# Patient Record
Sex: Female | Born: 1990 | Race: Black or African American | Hispanic: No | Marital: Single | Smoking: Never smoker
Health system: Southern US, Community
[De-identification: ages and names within clinical notes are randomized; demographics above are authoritative.]

## PROBLEM LIST (undated history)

## (undated) DIAGNOSIS — N83209 Unspecified ovarian cyst, unspecified side: Secondary | ICD-10-CM

## (undated) DIAGNOSIS — I10 Essential (primary) hypertension: Secondary | ICD-10-CM

## (undated) DIAGNOSIS — Z87440 Personal history of urinary (tract) infections: Secondary | ICD-10-CM

## (undated) HISTORY — PX: NO PAST SURGERIES: SHX2092

## (undated) HISTORY — DX: Personal history of urinary (tract) infections: Z87.440

## (undated) HISTORY — DX: Essential (primary) hypertension: I10

## (undated) HISTORY — PX: OTHER SURGICAL HISTORY: SHX169

---
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
## 2012-11-23 ENCOUNTER — Emergency Department (HOSPITAL_COMMUNITY)
Admission: EM | Admit: 2012-11-23 | Discharge: 2012-11-23 | Disposition: A | Discharge status: Home / Self Care | Payer: No Typology Code available for payment source | Attending: Emergency Medicine | Admitting: Emergency Medicine

## 2012-11-23 DIAGNOSIS — S8010XA Contusion of unspecified lower leg, initial encounter: Secondary | ICD-10-CM | POA: Insufficient documentation

## 2012-11-23 DIAGNOSIS — T148XXA Other injury of unspecified body region, initial encounter: Secondary | ICD-10-CM

## 2012-11-23 DIAGNOSIS — Y92410 Unspecified street and highway as the place of occurrence of the external cause: Secondary | ICD-10-CM | POA: Insufficient documentation

## 2012-11-23 DIAGNOSIS — Y9389 Activity, other specified: Secondary | ICD-10-CM | POA: Insufficient documentation

## 2012-11-23 DIAGNOSIS — IMO0002 Reserved for concepts with insufficient information to code with codable children: Secondary | ICD-10-CM | POA: Insufficient documentation

## 2012-11-23 MED ORDER — IBUPROFEN 600 MG PO TABS: 600.0000 mg | ORAL_TABLET | Freq: Once | ORAL | Status: DC

## 2012-11-23 MED ORDER — IBUPROFEN 200 MG PO TABS
600.0000 mg | ORAL_TABLET | Freq: Once | ORAL | Status: AC
  Administered 2012-11-23: 600 mg via ORAL
  Filled 2012-11-23: qty 3

## 2012-11-23 NOTE — ED Provider Notes (Signed)
Medical screening examination/treatment/procedure(s) were performed by non-physician practitioner and as supervising physician I was immediately available for consultation/collaboration.  EKG Interpretation   None        Derwood Kaplan, MD 11/23/12 2216
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
## 2012-11-23 NOTE — ED Notes (Signed)
Patient is alert and oriented x3.  She was given DC instructions and follow up visit instructions.  Patient gave verbal understanding. She was DC ambulatory under her own power to home.  V/S stable.  He was not showing any signs of distress on DC 

## 2015-01-01 ENCOUNTER — Emergency Department (HOSPITAL_COMMUNITY): Payer: BLUE CROSS/BLUE SHIELD

## 2015-01-01 ENCOUNTER — Observation Stay (HOSPITAL_COMMUNITY)
Admission: EM | Admit: 2015-01-01 | Discharge: 2015-01-02 | Disposition: A | Discharge status: Other Acute Inpatient Hospital | Payer: BLUE CROSS/BLUE SHIELD | Attending: Obstetrics & Gynecology | Admitting: Obstetrics & Gynecology

## 2015-01-01 ENCOUNTER — Encounter (HOSPITAL_COMMUNITY): Payer: Self-pay | Admitting: Emergency Medicine

## 2015-01-01 DIAGNOSIS — R109 Unspecified abdominal pain: Secondary | ICD-10-CM | POA: Diagnosis present

## 2015-01-01 DIAGNOSIS — N831 Corpus luteum cyst of ovary, unspecified side: Secondary | ICD-10-CM | POA: Diagnosis present

## 2015-01-01 DIAGNOSIS — K659 Peritonitis, unspecified: Secondary | ICD-10-CM

## 2015-01-01 DIAGNOSIS — N83209 Unspecified ovarian cyst, unspecified side: Secondary | ICD-10-CM

## 2015-01-01 DIAGNOSIS — N83299 Other ovarian cyst, unspecified side: Principal | ICD-10-CM | POA: Insufficient documentation

## 2015-01-01 DIAGNOSIS — R102 Pelvic and perineal pain: Secondary | ICD-10-CM

## 2015-01-01 DIAGNOSIS — N72 Inflammatory disease of cervix uteri: Secondary | ICD-10-CM

## 2015-01-01 DIAGNOSIS — R58 Hemorrhage, not elsewhere classified: Secondary | ICD-10-CM

## 2015-01-01 LAB — CBC WITH DIFFERENTIAL/PLATELET
BASOS PCT: 0 %
BASOS PCT: 0 %
Basophils Absolute: 0 10*3/uL (ref 0.0–0.1)
Basophils Absolute: 0 10*3/uL (ref 0.0–0.1)
EOS ABS: 0 10*3/uL (ref 0.0–0.7)
EOS ABS: 0 10*3/uL (ref 0.0–0.7)
Eosinophils Relative: 0 %
Eosinophils Relative: 0 %
HCT: 30 % — ABNORMAL LOW (ref 36.0–46.0)
HEMATOCRIT: 25.9 % — AB (ref 36.0–46.0)
HEMOGLOBIN: 9.7 g/dL — AB (ref 12.0–15.0)
HEMOGLOBIN: 8.6 g/dL — AB (ref 12.0–15.0)
LYMPHS ABS: 1 10*3/uL (ref 0.7–4.0)
Lymphocytes Relative: 4 %
Lymphocytes Relative: 7 %
Lymphs Abs: 0.6 10*3/uL — ABNORMAL LOW (ref 0.7–4.0)
MCH: 27.9 pg (ref 26.0–34.0)
MCH: 28.7 pg (ref 26.0–34.0)
MCHC: 32.3 g/dL (ref 30.0–36.0)
MCHC: 33.2 g/dL (ref 30.0–36.0)
MCV: 86.2 fL (ref 78.0–100.0)
MCV: 86.3 fL (ref 78.0–100.0)
MONOS PCT: 3 %
Monocytes Absolute: 0.4 10*3/uL (ref 0.1–1.0)
Monocytes Absolute: 0.4 10*3/uL (ref 0.1–1.0)
Monocytes Relative: 3 %
NEUTROS ABS: 15.5 10*3/uL — AB (ref 1.7–7.7)
NEUTROS ABS: 11.7 10*3/uL — AB (ref 1.7–7.7)
NEUTROS PCT: 94 %
NEUTROS PCT: 90 %
Platelets: 274 10*3/uL (ref 150–400)
Platelets: 249 10*3/uL (ref 150–400)
RBC: 3.48 MIL/uL — AB (ref 3.87–5.11)
RBC: 3 MIL/uL — AB (ref 3.87–5.11)
RDW: 13.2 % (ref 11.5–15.5)
RDW: 13.3 % (ref 11.5–15.5)
WBC: 16.5 10*3/uL — AB (ref 4.0–10.5)
WBC: 13.1 10*3/uL — AB (ref 4.0–10.5)

## 2015-01-01 LAB — I-STAT CHEM 8, ED
BUN: 15 mg/dL (ref 6–20)
CALCIUM ION: 1.17 mmol/L (ref 1.12–1.23)
CHLORIDE: 104 mmol/L (ref 101–111)
Creatinine, Ser: 0.7 mg/dL (ref 0.44–1.00)
GLUCOSE: 121 mg/dL — AB (ref 65–99)
HCT: 36 % (ref 36.0–46.0)
Hemoglobin: 12.2 g/dL (ref 12.0–15.0)
Potassium: 4.3 mmol/L (ref 3.5–5.1)
Sodium: 138 mmol/L (ref 135–145)
TCO2: 24 mmol/L (ref 0–100)

## 2015-01-01 LAB — CBC
HCT: 35.5 % — ABNORMAL LOW (ref 36.0–46.0)
Hemoglobin: 11.3 g/dL — ABNORMAL LOW (ref 12.0–15.0)
MCH: 27.6 pg (ref 26.0–34.0)
MCHC: 31.8 g/dL (ref 30.0–36.0)
MCV: 86.6 fL (ref 78.0–100.0)
PLATELETS: 302 10*3/uL (ref 150–400)
RBC: 4.1 MIL/uL (ref 3.87–5.11)
RDW: 13.2 % (ref 11.5–15.5)
WBC: 20.7 10*3/uL — ABNORMAL HIGH (ref 4.0–10.5)

## 2015-01-01 LAB — COMPREHENSIVE METABOLIC PANEL
ALBUMIN: 3.9 g/dL (ref 3.5–5.0)
ALK PHOS: 52 U/L (ref 38–126)
ALT: 12 U/L — AB (ref 14–54)
AST: 20 U/L (ref 15–41)
Anion gap: 10 (ref 5–15)
BUN: 15 mg/dL (ref 6–20)
CALCIUM: 9.2 mg/dL (ref 8.9–10.3)
CHLORIDE: 105 mmol/L (ref 101–111)
CO2: 23 mmol/L (ref 22–32)
CREATININE: 0.8 mg/dL (ref 0.44–1.00)
GFR calc non Af Amer: >60 mL/min (ref >60)
GLUCOSE: 129 mg/dL — AB (ref 65–99)
Potassium: 4.2 mmol/L (ref 3.5–5.1)
SODIUM: 138 mmol/L (ref 135–145)
Total Bilirubin: 0.8 mg/dL (ref 0.3–1.2)
Total Protein: 7.5 g/dL (ref 6.5–8.1)

## 2015-01-01 LAB — URINE MICROSCOPIC-ADD ON

## 2015-01-01 LAB — URINALYSIS, ROUTINE W REFLEX MICROSCOPIC
BILIRUBIN URINE: NEGATIVE
GLUCOSE, UA: NEGATIVE mg/dL
HGB URINE DIPSTICK: NEGATIVE
KETONES UR: 15 mg/dL — AB
NITRITE: NEGATIVE
PH: 5.5 (ref 5.0–8.0)
Protein, ur: 30 mg/dL — AB
SPECIFIC GRAVITY, URINE: 1.024 (ref 1.005–1.030)

## 2015-01-01 LAB — POC URINE PREG, ED: Preg Test, Ur: NEGATIVE

## 2015-01-01 LAB — CBG MONITORING, ED: Glucose-Capillary: 105 mg/dL — ABNORMAL HIGH (ref 65–99)

## 2015-01-01 LAB — HEMOGLOBIN AND HEMATOCRIT, BLOOD
HEMATOCRIT: 28.4 % — AB (ref 36.0–46.0)
HEMOGLOBIN: 9 g/dL — AB (ref 12.0–15.0)

## 2015-01-01 LAB — HCG, QUANTITATIVE, PREGNANCY: hCG, Beta Chain, Quant, S: <1 m[IU]/mL (ref <5)

## 2015-01-01 LAB — WET PREP, GENITAL
SPERM: NONE SEEN
TRICH WET PREP: NONE SEEN
YEAST WET PREP: NONE SEEN

## 2015-01-01 LAB — LIPASE, BLOOD: LIPASE: 26 U/L (ref 11–51)

## 2015-01-01 MED ORDER — MORPHINE SULFATE (PF) 2 MG/ML IV SOLN
2.0000 mg | Freq: Once | INTRAVENOUS | Status: AC
  Administered 2015-01-01: 2 mg via INTRAVENOUS
  Filled 2015-01-01: qty 1

## 2015-01-01 MED ORDER — MORPHINE SULFATE (PF) 4 MG/ML IV SOLN
4.0000 mg | Freq: Once | INTRAVENOUS | Status: AC
  Administered 2015-01-01: 4 mg via INTRAVENOUS
  Filled 2015-01-01: qty 1

## 2015-01-01 MED ORDER — SODIUM CHLORIDE 0.9 % IV SOLN
8.0000 mg | Freq: Three times a day (TID) | INTRAVENOUS | Status: DC | PRN
  Administered 2015-01-01: 8 mg via INTRAVENOUS
  Filled 2015-01-01 (×2): qty 4

## 2015-01-01 MED ORDER — HYDROMORPHONE HCL 1 MG/ML IJ SOLN
1.0000 mg | INTRAMUSCULAR | Status: DC | PRN
  Administered 2015-01-01: 1 mg via INTRAVENOUS
  Filled 2015-01-01: qty 1

## 2015-01-01 MED ORDER — STERILE WATER FOR INJECTION IJ SOLN
INTRAMUSCULAR | Status: AC
  Administered 2015-01-01: 10 mL
  Filled 2015-01-01: qty 10

## 2015-01-01 MED ORDER — OXYCODONE-ACETAMINOPHEN 5-325 MG PO TABS
1.0000 | ORAL_TABLET | ORAL | Status: DC | PRN
  Administered 2015-01-01 – 2015-01-02 (×2): 2 via ORAL
  Filled 2015-01-01 (×2): qty 2

## 2015-01-01 MED ORDER — ONDANSETRON HCL 4 MG/2ML IJ SOLN
4.0000 mg | Freq: Once | INTRAMUSCULAR | Status: AC
  Administered 2015-01-01: 4 mg via INTRAVENOUS
  Filled 2015-01-01: qty 2

## 2015-01-01 MED ORDER — SODIUM CHLORIDE 0.9 % IV BOLUS (SEPSIS)
1000.0000 mL | Freq: Once | INTRAVENOUS | Status: AC
  Administered 2015-01-01: 1000 mL via INTRAVENOUS

## 2015-01-01 MED ORDER — AZITHROMYCIN 1 G PO PACK
1.0000 g | PACK | Freq: Once | ORAL | Status: AC
  Administered 2015-01-01: 1 g via ORAL
  Filled 2015-01-01: qty 1

## 2015-01-01 MED ORDER — HYDROMORPHONE HCL 1 MG/ML IJ SOLN
1.0000 mg | Freq: Once | INTRAMUSCULAR | Status: AC
Start: 2015-01-01 — End: 2015-01-01
  Administered 2015-01-01: 1 mg via INTRAVENOUS
  Filled 2015-01-01: qty 1

## 2015-01-01 MED ORDER — IOHEXOL 300 MG/ML  SOLN
50.0000 mL | Freq: Once | INTRAMUSCULAR | Status: AC | PRN
  Administered 2015-01-01: 50 mL via ORAL

## 2015-01-01 MED ORDER — CEFTRIAXONE SODIUM 250 MG IJ SOLR
250.0000 mg | Freq: Once | INTRAMUSCULAR | Status: AC
  Administered 2015-01-01: 250 mg via INTRAMUSCULAR
  Filled 2015-01-01: qty 250

## 2015-01-01 MED ORDER — DICYCLOMINE HCL 10 MG/ML IM SOLN
20.0000 mg | Freq: Once | INTRAMUSCULAR | Status: AC
  Administered 2015-01-01: 20 mg via INTRAMUSCULAR
  Filled 2015-01-01: qty 2

## 2015-01-01 MED ORDER — KETOROLAC TROMETHAMINE 30 MG/ML IJ SOLN
30.0000 mg | Freq: Once | INTRAMUSCULAR | Status: AC
  Administered 2015-01-01: 30 mg via INTRAVENOUS
  Filled 2015-01-01: qty 1

## 2015-01-01 MED ORDER — FENTANYL CITRATE (PF) 100 MCG/2ML IJ SOLN
50.0000 ug | Freq: Once | INTRAMUSCULAR | Status: AC
  Administered 2015-01-01: 50 ug via INTRAVENOUS
  Filled 2015-01-01: qty 2

## 2015-01-01 MED ORDER — IOHEXOL 300 MG/ML  SOLN
100.0000 mL | Freq: Once | INTRAMUSCULAR | Status: AC | PRN
  Administered 2015-01-01: 100 mL via INTRAVENOUS

## 2015-01-01 NOTE — ED Notes (Signed)
Per pts mother pt was sitting on the couch complaining of abdominal pain and then fell off of the cough in a fainting episode. Pt's mother unsure of how long she was out. Pt states she had a normal bowel movement at about 0040 tonight.

## 2015-01-01 NOTE — ED Notes (Signed)
Mother reports hx of Crohns in the family.

## 2015-01-01 NOTE — ED Notes (Signed)
Pt transported to CT ?

## 2015-01-01 NOTE — ED Provider Notes (Signed)
I received this patient in signout from Dr. Nicanor AlconPalumbo. Per her report, the patient presented with abdominal pain, vomiting, diarrhea, and an episode of syncope. CT head showed free fluid in the abdomen and complex ovarian structure concerning for cyst. We were awaiting results of ultrasound. Ultrasound showed a hemorrhagic cyst with the source of bleeding. Repeat hemoglobin is slightly lower than the last; 8.6 from 11.3 originally. Discussed w/ OBGYN, Dr. Despina HiddenEure, and recommended transfer for pain control and serial H/H. He has accepted pt and pt/mother in agreement. [facility] in stable condition.  Laurence Spatesachel Morgan Little, MD 01/01/15 [phone]

## 2015-01-01 NOTE — ED Provider Notes (Signed)
CSN: [id number]     Arrival date & time 01/01/15  0023 History   First MD Initiated Contact with Patient 01/01/15 [phone]     Chief Complaint  Patient presents with  . Abdominal Pain  . Near Syncope     (Consider location/radiation/quality/duration/timing/severity/associated sxs/prior Treatment) Patient is a 24 y.o. female presenting with abdominal pain. The history is provided by the patient.  Abdominal Pain Pain location:  Suprapubic Pain quality: stabbing   Pain radiation: entire abdomen. Pain severity:  Severe Onset quality:  Sudden Timing:  Constant Progression:  Unchanged Chronicity:  New Context: not trauma   Relieved by:  Nothing Worsened by:  Nothing tried Ineffective treatments:  None tried Associated symptoms: diarrhea, nausea and vomiting   Associated symptoms: no dysuria, no fever and no vaginal bleeding   Diarrhea:    Quality:  Watery   Number of occurrences:  5   Severity:  Moderate   Timing:  Intermittent   Progression:  Unchanged Risk factors: no recent hospitalization     History reviewed. No pertinent past medical history. History reviewed. No pertinent past surgical history. History reviewed. No pertinent family history. Social History  Substance Use Topics  . Smoking status: Never Smoker   . Smokeless tobacco: None  . Alcohol Use: No   OB History    No data available     Review of Systems  Constitutional: Negative for fever.  Gastrointestinal: Positive for nausea, vomiting, abdominal pain and diarrhea.  Genitourinary: Negative for dysuria and vaginal bleeding.  All other systems reviewed and are negative.     Allergies  Review of patient's allergies indicates no known allergies.  Home Medications   Prior to Admission medications   Not on File   BP 123/79 mmHg  Pulse 75  Temp(Src) 97.9 F (36.6 C) (Oral)  Resp 19  SpO2 100%  LMP 11/22/2014 Physical Exam  Constitutional: She is oriented to person, place, and time. She appears  well-developed and well-nourished. No distress.  HENT:  Head: Normocephalic and atraumatic.  Mouth/Throat: Oropharynx is clear and moist.  Eyes: Conjunctivae are normal. Pupils are equal, round, and reactive to light.  Neck: Normal range of motion. Neck supple.  Cardiovascular: Normal rate, regular rhythm and intact distal pulses.   Pulmonary/Chest: Effort normal and breath sounds normal. She has no wheezes. She has no rales.  Abdominal: She exhibits no distension and no mass. Bowel sounds are decreased. There is tenderness. There is guarding. There is no rebound, no tenderness at McBurney's point and negative Murphy's sign.  Genitourinary: Vaginal discharge found.  Yellowish, frothy chaperone present  Musculoskeletal: Normal range of motion.  Neurological: She is alert and oriented to person, place, and time.  Skin: Skin is warm and dry.  Psychiatric: She has a normal mood and affect.    ED Course  Procedures (including critical care time) Labs Review Labs Reviewed  URINALYSIS, ROUTINE W REFLEX MICROSCOPIC (NOT AT [facility]) - Abnormal; Notable for the following:    APPearance CLOUDY (*)    Ketones, ur 15 (*)    Protein, ur 30 (*)    Leukocytes, UA TRACE (*)    All other components within normal limits  COMPREHENSIVE METABOLIC PANEL - Abnormal; Notable for the following:    Glucose, Bld 129 (*)    ALT 12 (*)    All other components within normal limits  CBC - Abnormal; Notable for the following:    WBC 20.7 (*)    Hemoglobin 11.3 (*)    HCT  35.5 (*)    All other components within normal limits  URINE MICROSCOPIC-ADD ON - Abnormal; Notable for the following:    Squamous Epithelial / LPF 0-5 (*)    Bacteria, UA MANY (*)    Casts HYALINE CASTS (*)    All other components within normal limits  CBG MONITORING, ED - Abnormal; Notable for the following:    Glucose-Capillary 105 (*)    All other components within normal limits  I-STAT CHEM 8, ED - Abnormal; Notable for the following:     Glucose, Bld 121 (*)    All other components within normal limits  LIPASE, BLOOD  HCG, QUANTITATIVE, PREGNANCY  POC URINE PREG, ED    Imaging Review Dg Abd Acute W/chest  01/01/2015  CLINICAL DATA:  Acute onset of lower mid abdominal pain. Initial encounter. EXAM: DG ABDOMEN ACUTE W/ 1V CHEST COMPARISON:  None. FINDINGS: The lungs are well-aerated and clear. There is no evidence of focal opacification, pleural effusion or pneumothorax. The cardiomediastinal silhouette is borderline normal in size. The visualized bowel gas pattern is unremarkable. Scattered stool and air are seen within the colon; there is no evidence of small bowel dilatation to suggest obstruction. No free intra-abdominal air is identified on the provided upright view. No acute osseous abnormalities are seen; the sacroiliac joints are unremarkable in appearance. IMPRESSION: 1. Unremarkable bowel gas pattern; no free intra-abdominal air seen. Small amount of stool noted in the colon. 2. No acute cardiopulmonary process seen. Electronically Signed   By: Roanna Raider M.D.   On: 01/01/2015 05:56   I have personally reviewed and evaluated these images and lab results as part of my medical decision-making.   EKG Interpretation   Date/Time:  Saturday January 01 2015 03:47:38 EST Ventricular Rate:  78 PR Interval:  148 QRS Duration: 79 QT Interval:  378 QTC Calculation: 430 R Axis:   57 Text Interpretation:  Sinus rhythm Confirmed by [facility]  MD, Morene Antu  ([id number]) on 01/01/2015 3:51:54 AM      MDM   Final diagnoses:  None     Results for orders placed or performed during the hospital encounter of 01/01/15  Urinalysis, Routine w reflex microscopic (not at [facility])  Result Value Ref Range   Color, Urine YELLOW YELLOW   APPearance CLOUDY (A) CLEAR   Specific Gravity, Urine 1.024 1.005 - 1.030   pH 5.5 5.0 - 8.0   Glucose, UA NEGATIVE NEGATIVE mg/dL   Hgb urine dipstick NEGATIVE NEGATIVE   Bilirubin Urine  NEGATIVE NEGATIVE   Ketones, ur 15 (A) NEGATIVE mg/dL   Protein, ur 30 (A) NEGATIVE mg/dL   Nitrite NEGATIVE NEGATIVE   Leukocytes, UA TRACE (A) NEGATIVE  Lipase, blood  Result Value Ref Range   Lipase 26 11 - 51 U/L  Comprehensive metabolic panel  Result Value Ref Range   Sodium 138 135 - 145 mmol/L   Potassium 4.2 3.5 - 5.1 mmol/L   Chloride 105 101 - 111 mmol/L   CO2 23 22 - 32 mmol/L   Glucose, Bld 129 (H) 65 - 99 mg/dL   BUN 15 6 - 20 mg/dL   Creatinine, Ser 4.69 0.44 - 1.00 mg/dL   Calcium 9.2 8.9 - 62.9 mg/dL   Total Protein 7.5 6.5 - 8.1 g/dL   Albumin 3.9 3.5 - 5.0 g/dL   AST 20 15 - 41 U/L   ALT 12 (L) 14 - 54 U/L   Alkaline Phosphatase 52 38 - 126 U/L   Total Bilirubin 0.8  0.3 - 1.2 mg/dL   GFR calc non Af Amer >60 >60 mL/min   GFR calc Af Amer >60 >60 mL/min   Anion gap 10 5 - 15  CBC  Result Value Ref Range   WBC 20.7 (H) 4.0 - 10.5 K/uL   RBC 4.10 3.87 - 5.11 MIL/uL   Hemoglobin 11.3 (L) 12.0 - 15.0 g/dL   HCT 16.1 (L) 09.6 - 04.5 %   MCV 86.6 78.0 - 100.0 fL   MCH 27.6 26.0 - 34.0 pg   MCHC 31.8 30.0 - 36.0 g/dL   RDW 40.9 81.1 - 91.4 %   Platelets 302 150 - 400 K/uL  Urine microscopic-add on  Result Value Ref Range   Squamous Epithelial / LPF 0-5 (A) NONE SEEN   WBC, UA 0-5 0 - 5 WBC/hpf   RBC / HPF 0-5 0 - 5 RBC/hpf   Bacteria, UA MANY (A) NONE SEEN   Casts HYALINE CASTS (A) NEGATIVE   Urine-Other MUCOUS PRESENT   POC Urine Pregnancy, ED (do NOT order at [facility])  Result Value Ref Range   Preg Test, Ur NEGATIVE NEGATIVE  CBG monitoring, ED  Result Value Ref Range   Glucose-Capillary 105 (H) 65 - 99 mg/dL   Comment 1 Notify RN    Comment 2 Document in Chart   I-Stat Chem 8, ED  Result Value Ref Range   Sodium 138 135 - 145 mmol/L   Potassium 4.3 3.5 - 5.1 mmol/L   Chloride 104 101 - 111 mmol/L   BUN 15 6 - 20 mg/dL   Creatinine, Ser 7.82 0.44 - 1.00 mg/dL   Glucose, Bld 956 (H) 65 - 99 mg/dL   Calcium, Ion 2.13 0.86 - 1.23 mmol/L   TCO2 24 0  - 100 mmol/L   Hemoglobin 12.2 12.0 - 15.0 g/dL   HCT 57.8 46.9 - 62.9 %   Ct Abdomen Pelvis W Contrast  01/01/2015  CLINICAL DATA:  Acute onset of generalized abdominal pain. Leukocytosis. Initial encounter. EXAM: CT ABDOMEN AND PELVIS WITH CONTRAST TECHNIQUE: Multidetector CT imaging of the abdomen and pelvis was performed using the standard protocol following bolus administration of intravenous contrast. CONTRAST:  OMNIPAQUE IOHEXOL 300 MG/ML  SOLN COMPARISON:  Abdominal radiograph performed earlier today at 5:42 a.m. FINDINGS: The visualized lung bases are clear. The liver and spleen are unremarkable in appearance. The gallbladder is within normal limits. The pancreas and adrenal glands are unremarkable. The kidneys are unremarkable in appearance. There is no evidence of hydronephrosis. No renal or ureteral stones are seen. No perinephric stranding is appreciated. The small bowel is unremarkable in appearance. The stomach is within normal limits. No acute vascular abnormalities are seen. A moderate amount of blood is noted within the pelvis, with additional serosanguineous fluid tracking superiorly along both lower quadrants and the paracolic gutters, and a small amount of fluid tracking about the liver and spleen. This appears to reflect rupture of a partially cystic lesion at the left adnexa, measuring approximately 4.1 cm in size. The structure of the lesion at the left adnexa raises question for ectopic pregnancy, despite the negative urine pregnancy test; an unusual ruptured hemorrhagic cyst might have a similar appearance. The appendix is normal in caliber, without evidence for appendicitis. The colon is grossly unremarkable in appearance. The bladder is moderately distended and grossly unremarkable. The uterus is unremarkable in appearance. The ovaries are difficult to fully characterize due to surrounding blood. No inguinal lymphadenopathy is seen. No acute osseous abnormalities are  identified. IMPRESSION: 1. Moderate amount of blood noted within the pelvis, with additional serosanguineous fluid tracking superiorly along the lower quadrants and paracolic gutters, and a small amount of fluid tracking about the liver and spleen. This appears to reflect rupture of a partially cystic lesion at the left adnexa. 2. 4.1 cm left adnexal lesion demonstrates internal structure that raises question for ruptured ectopic pregnancy, despite the negative urine pregnancy test. An unusual ruptured hemorrhagic cyst might have a similar appearance. Would correlate with quantitative beta HCG results. Critical Value/emergent results were called by telephone at the time of interpretation on 01/01/2015 at 6:47 am to Dr. Cy Blamer, who verbally acknowledged these results. Electronically Signed   By: Roanna Raider M.D.   On: 01/01/2015 06:56   Dg Abd Acute W/chest  01/01/2015  CLINICAL DATA:  Acute onset of lower mid abdominal pain. Initial encounter. EXAM: DG ABDOMEN ACUTE W/ 1V CHEST COMPARISON:  None. FINDINGS: The lungs are well-aerated and clear. There is no evidence of focal opacification, pleural effusion or pneumothorax. The cardiomediastinal silhouette is borderline normal in size. The visualized bowel gas pattern is unremarkable. Scattered stool and air are seen within the colon; there is no evidence of small bowel dilatation to suggest obstruction. No free intra-abdominal air is identified on the provided upright view. No acute osseous abnormalities are seen; the sacroiliac joints are unremarkable in appearance. IMPRESSION: 1. Unremarkable bowel gas pattern; no free intra-abdominal air seen. Small amount of stool noted in the colon. 2. No acute cardiopulmonary process seen. Electronically Signed   By: Roanna Raider M.D.   On: 01/01/2015 05:56   Medications  cefTRIAXone (ROCEPHIN) injection 250 mg (not administered)  azithromycin (ZITHROMAX) powder 1 g (not administered)  HYDROmorphone  (DILAUDID) injection 1 mg (not administered)  sodium chloride 0.9 % bolus 1,000 mL (not administered)  HYDROmorphone (DILAUDID) injection 1 mg (not administered)  sodium chloride 0.9 % bolus 1,000 mL (0 mLs Intravenous Stopped 01/01/15 0625)  ondansetron (ZOFRAN) injection 4 mg (4 mg Intravenous Given 01/01/15 0520)  fentaNYL (SUBLIMAZE) injection 50 mcg (50 mcg Intravenous Given 01/01/15 0520)  iohexol (OMNIPAQUE) 300 MG/ML solution 50 mL (50 mLs Oral Contrast Given 01/01/15 0538)  ketorolac (TORADOL) 30 MG/ML injection 30 mg (30 mg Intravenous Given 01/01/15 0611)  morphine 2 MG/ML injection 2 mg (2 mg Intravenous Given 01/01/15 0611)  dicyclomine (BENTYL) injection 20 mg (20 mg Intramuscular Given 01/01/15 0612)  iohexol (OMNIPAQUE) 300 MG/ML solution 100 mL (100 mLs Intravenous Contrast Given 01/01/15 0620)  sodium chloride 0.9 % bolus 1,000 mL (0 mLs Intravenous Stopped 01/01/15 0746)  sodium chloride 0.9 % bolus 1,000 mL (1,000 mLs Intravenous New Bag/Given 01/01/15 0747)  morphine 4 MG/ML injection 4 mg (4 mg Intravenous Given 01/01/15 0746)    Case d/w Dr. Emelda Fear, repeat CBC and do ultrasound.  Await results and contact Dr. Despina Hidden with results  Signed out to Dr. Clarene Duke pending repeat CBC and ultrasound report   Bert Givans, MD 01/01/15 [phone]

## 2015-01-01 NOTE — H&P (Signed)
Treatment) Patient is a 24 y.o. female presenting with abdominal pain. The history is provided by the patient.  Abdominal Pain Pain location: Suprapubic Pain quality: stabbing  Pain radiation: entire abdomen. Pain severity: Severe Onset quality: Sudden Timing: Constant Progression: Unchanged Chronicity: New Context: not trauma  Relieved by: Nothing Worsened by: Nothing tried Ineffective treatments: None tried Associated symptoms: diarrhea, nausea and vomiting  Associated symptoms: no dysuria, no fever and no vaginal bleeding  Diarrhea:   Quality: Watery  Number of occurrences: 5  Severity: Moderate  Timing: Intermittent  Progression: Unchanged Risk factors: no recent hospitalization    History reviewed. No pertinent past medical history. History reviewed. No pertinent past surgical history. History reviewed. No pertinent family history. Social History  Substance Use Topics  . Smoking status: Never Smoker   . Smokeless tobacco: None  . Alcohol Use: No   OB History    No data available     Review of Systems  Constitutional: Negative for fever.  Gastrointestinal: Positive for nausea, vomiting, abdominal pain and diarrhea.  Genitourinary: Negative for dysuria and vaginal bleeding.  All other systems reviewed and are negative.     Allergies  Review of patient's allergies indicates no known allergies.  Home Medications   Prior to Admission medications   Not on File   BP 123/79 mmHg  Pulse 75  Temp(Src) 97.9 F (36.6 C) (Oral)  Resp 19  SpO2 100%  LMP 11/22/2014 Physical Exam  Constitutional: She is oriented to person, place, and time. She appears well-developed and well-nourished. No distress.  HENT:  Head: Normocephalic and atraumatic.  Mouth/Throat: Oropharynx is clear and moist.  Eyes: Conjunctivae are normal. Pupils are equal, round, and reactive to light.  Neck: Normal range of motion. Neck  supple.  Cardiovascular: Normal rate, regular rhythm and intact distal pulses.  Pulmonary/Chest: Effort normal and breath sounds normal. She has no wheezes. She has no rales.  Abdominal: She exhibits no distension and no mass. Bowel sounds are decreased. There is tenderness. There is guarding. There is no rebound, no tenderness at McBurney's point and negative Murphy's sign.  Genitourinary: Vaginal discharge found.  Yellowish, frothy chaperone present  Musculoskeletal: Normal range of motion.  Neurological: She is alert and oriented to person, place, and time.  Skin: Skin is warm and dry.  Psychiatric: She has a normal mood and affect.    ED Course  Procedures (including critical care time) Labs Review Labs Reviewed  URINALYSIS, ROUTINE W REFLEX MICROSCOPIC (NOT AT [facility]) - Abnormal; Notable for the following:    APPearance CLOUDY (*)    Ketones, ur 15 (*)    Protein, ur 30 (*)    Leukocytes, UA TRACE (*)    All other components within normal limits  COMPREHENSIVE METABOLIC PANEL - Abnormal; Notable for the following:    Glucose, Bld 129 (*)    ALT 12 (*)    All other components within normal limits  CBC - Abnormal; Notable for the following:    WBC 20.7 (*)    Hemoglobin 11.3 (*)    HCT 35.5 (*)    All other components within normal limits  URINE MICROSCOPIC-ADD ON - Abnormal; Notable for the following:    Squamous Epithelial / LPF 0-5 (*)    Bacteria, UA MANY (*)    Casts HYALINE CASTS (*)    All other components within normal limits  CBG MONITORING, ED - Abnormal; Notable for the following:    Glucose-Capillary 105 (*)    All other  components within normal limits  I-STAT CHEM 8, ED - Abnormal; Notable for the following:    Glucose, Bld 121 (*)    All other components within normal limits  LIPASE, BLOOD  HCG, QUANTITATIVE, PREGNANCY  POC URINE PREG, ED    Imaging Review  Imaging Results  (Last 48 hours)    Dg Abd Acute W/chest  01/01/2015 CLINICAL DATA: Acute onset of lower mid abdominal pain. Initial encounter. EXAM: DG ABDOMEN ACUTE W/ 1V CHEST COMPARISON: None. FINDINGS: The lungs are well-aerated and clear. There is no evidence of focal opacification, pleural effusion or pneumothorax. The cardiomediastinal silhouette is borderline normal in size. The visualized bowel gas pattern is unremarkable. Scattered stool and air are seen within the colon; there is no evidence of small bowel dilatation to suggest obstruction. No free intra-abdominal air is identified on the provided upright view. No acute osseous abnormalities are seen; the sacroiliac joints are unremarkable in appearance. IMPRESSION: 1. Unremarkable bowel gas pattern; no free intra-abdominal air seen. Small amount of stool noted in the colon. 2. No acute cardiopulmonary process seen. Electronically Signed By: Roanna Raider M.D. On: 01/01/2015 05:56    I have personally reviewed and evaluated these images and lab results as part of my medical decision-making.   EKG Interpretation   Date/Time: Saturday January 01 2015 03:47:38 EST Ventricular Rate: 78 PR Interval: 148 QRS Duration: 79 QT Interval: 378 QTC Calculation: 430 R Axis: 57 Text Interpretation: Sinus rhythm Confirmed by [facility] MD, Morene Antu  ([id number]) on 01/01/2015 3:51:54 AM    MDM   Final diagnoses:  None     Results for orders placed or performed during the hospital encounter of 01/01/15  Urinalysis, Routine w reflex microscopic (not at [facility])  Result Value Ref Range   Color, Urine YELLOW YELLOW   APPearance CLOUDY (A) CLEAR   Specific Gravity, Urine 1.024 1.005 - 1.030   pH 5.5 5.0 - 8.0   Glucose, UA NEGATIVE NEGATIVE mg/dL   Hgb urine dipstick NEGATIVE NEGATIVE   Bilirubin Urine NEGATIVE NEGATIVE   Ketones, ur 15 (A) NEGATIVE mg/dL   Protein, ur 30 (A) NEGATIVE  mg/dL   Nitrite NEGATIVE NEGATIVE   Leukocytes, UA TRACE (A) NEGATIVE  Lipase, blood  Result Value Ref Range   Lipase 26 11 - 51 U/L  Comprehensive metabolic panel  Result Value Ref Range   Sodium 138 135 - 145 mmol/L   Potassium 4.2 3.5 - 5.1 mmol/L   Chloride 105 101 - 111 mmol/L   CO2 23 22 - 32 mmol/L   Glucose, Bld 129 (H) 65 - 99 mg/dL   BUN 15 6 - 20 mg/dL   Creatinine, Ser 4.78 0.44 - 1.00 mg/dL   Calcium 9.2 8.9 - 29.5 mg/dL   Total Protein 7.5 6.5 - 8.1 g/dL   Albumin 3.9 3.5 - 5.0 g/dL   AST 20 15 - 41 U/L   ALT 12 (L) 14 - 54 U/L   Alkaline Phosphatase 52 38 - 126 U/L   Total Bilirubin 0.8 0.3 - 1.2 mg/dL   GFR calc non Af Amer >60 >60 mL/min   GFR calc Af Amer >60 >60 mL/min   Anion gap 10 5 - 15  CBC  Result Value Ref Range   WBC 20.7 (H) 4.0 - 10.5 K/uL   RBC 4.10 3.87 - 5.11 MIL/uL   Hemoglobin 11.3 (L) 12.0 - 15.0 g/dL   HCT 62.1 (L) 30.8 - 65.7 %   MCV 86.6 78.0 - 100.0 fL  MCH 27.6 26.0 - 34.0 pg   MCHC 31.8 30.0 - 36.0 g/dL   RDW 16.113.2 09.611.5 - 04.515.5 %   Platelets 302 150 - 400 K/uL  Urine microscopic-add on  Result Value Ref Range   Squamous Epithelial / LPF 0-5 (A) NONE SEEN   WBC, UA 0-5 0 - 5 WBC/hpf   RBC / HPF 0-5 0 - 5 RBC/hpf   Bacteria, UA MANY (A) NONE SEEN   Casts HYALINE CASTS (A) NEGATIVE   Urine-Other MUCOUS PRESENT   POC Urine Pregnancy, ED ([facility])  Result Value Ref Range   Preg Test, Ur NEGATIVE NEGATIVE  CBG monitoring, ED  Result Value Ref Range   Glucose-Capillary 105 (H) 65 - 99 mg/dL   Comment 1 Notify RN    Comment 2 Document in Chart   I-Stat Chem 8, ED  Result Value Ref Range   Sodium 138 135 - 145 mmol/L   Potassium 4.3 3.5 - 5.1 mmol/L   Chloride 104 101 - 111 mmol/L   BUN 15 6 - 20 mg/dL   Creatinine,  Ser 4.090.70 0.44 - 1.00 mg/dL   Glucose, Bld 811121 (H) 65 - 99 mg/dL   Calcium, Ion 9.141.17 7.821.12 - 1.23 mmol/L   TCO2 24 0 - 100 mmol/L   Hemoglobin 12.2 12.0 - 15.0 g/dL   HCT 95.636.0 21.336.0 - 08.646.0 %    Imaging Results    Ct Abdomen Pelvis W Contrast  01/01/2015 CLINICAL DATA: Acute onset of generalized abdominal pain. Leukocytosis. Initial encounter. EXAM: CT ABDOMEN AND PELVIS WITH CONTRAST TECHNIQUE: Multidetector CT imaging of the abdomen and pelvis was performed using the standard protocol following bolus administration of intravenous contrast. CONTRAST: 100mL OMNIPAQUE IOHEXOL 300 MG/ML SOLN COMPARISON: Abdominal radiograph performed earlier today at 5:42 a.m. FINDINGS: The visualized lung bases are clear. The liver and spleen are unremarkable in appearance. The gallbladder is within normal limits. The pancreas and adrenal glands are unremarkable. The kidneys are unremarkable in appearance. There is no evidence of hydronephrosis. No renal or ureteral stones are seen. No perinephric stranding is appreciated. The small bowel is unremarkable in appearance. The stomach is within normal limits. No acute vascular abnormalities are seen. A moderate amount of blood is noted within the pelvis, with additional serosanguineous fluid tracking superiorly along both lower quadrants and the paracolic gutters, and a small amount of fluid tracking about the liver and spleen. This appears to reflect rupture of a partially cystic lesion at the left adnexa, measuring approximately 4.1 cm in size. The structure of the lesion at the left adnexa raises question for ectopic pregnancy, despite the negative urine pregnancy test; an unusual ruptured hemorrhagic cyst might have a similar appearance. The appendix is normal in caliber, without evidence for appendicitis. The colon is grossly unremarkable in appearance. The bladder is moderately distended and grossly unremarkable. The uterus is unremarkable in  appearance. The ovaries are difficult to fully characterize due to surrounding blood. No inguinal lymphadenopathy is seen. No acute osseous abnormalities are identified. IMPRESSION: 1. Moderate amount of blood noted within the pelvis, with additional serosanguineous fluid tracking superiorly along the lower quadrants and paracolic gutters, and a small amount of fluid tracking about the liver and spleen. This appears to reflect rupture of a partially cystic lesion at the left adnexa. 2. 4.1 cm left adnexal lesion demonstrates internal structure that raises question for ruptured ectopic pregnancy, despite the negative urine pregnancy test. An unusual ruptured hemorrhagic cyst might have a similar  appearance. Would correlate with quantitative beta HCG results. Critical Value/emergent results were called by telephone at the time of interpretation on 01/01/2015 at 6:47 am to Dr. Cy Blamer, who verbally acknowledged these results. Electronically Signed By: Roanna Raider M.D. On: 01/01/2015 06:56   Dg Abd Acute W/chest  01/01/2015 CLINICAL DATA: Acute onset of lower mid abdominal pain. Initial encounter. EXAM: DG ABDOMEN ACUTE W/ 1V CHEST COMPARISON: None. FINDINGS: The lungs are well-aerated and clear. There is no evidence of focal opacification, pleural effusion or pneumothorax. The cardiomediastinal silhouette is borderline normal in size. The visualized bowel gas pattern is unremarkable. Scattered stool and air are seen within the colon; there is no evidence of small bowel dilatation to suggest obstruction. No free intra-abdominal air is identified on the provided upright view. No acute osseous abnormalities are seen; the sacroiliac joints are unremarkable in appearance. IMPRESSION: 1. Unremarkable bowel gas pattern; no free intra-abdominal air seen. Small amount of stool noted in the colon. 2. No acute cardiopulmonary process seen. Electronically Signed By: Roanna Raider M.D. On: 01/01/2015 05:56     Medications  cefTRIAXone (ROCEPHIN) injection 250 mg (not administered)  azithromycin (ZITHROMAX) powder 1 g (not administered)  HYDROmorphone (DILAUDID) injection 1 mg (not administered)  sodium chloride 0.9 % bolus 1,000 mL (not administered)  HYDROmorphone (DILAUDID) injection 1 mg (not administered)  sodium chloride 0.9 % bolus 1,000 mL (0 mLs Intravenous Stopped 01/01/15 0625)  ondansetron (ZOFRAN) injection 4 mg (4 mg Intravenous Given 01/01/15 0520)  fentaNYL (SUBLIMAZE) injection 50 mcg (50 mcg Intravenous Given 01/01/15 0520)  iohexol (OMNIPAQUE) 300 MG/ML solution 50 mL (50 mLs Oral Contrast Given 01/01/15 0538)  ketorolac (TORADOL) 30 MG/ML injection 30 mg (30 mg Intravenous Given 01/01/15 0611)  morphine 2 MG/ML injection 2 mg (2 mg Intravenous Given 01/01/15 0611)  dicyclomine (BENTYL) injection 20 mg (20 mg Intramuscular Given 01/01/15 0612)  iohexol (OMNIPAQUE) 300 MG/ML solution 100 mL (100 mLs Intravenous Contrast Given 01/01/15 0620)  sodium chloride 0.9 % bolus 1,000 mL (0 mLs Intravenous Stopped 01/01/15 0746)  sodium chloride 0.9 % bolus 1,000 mL (1,000 mLs Intravenous New Bag/Given 01/01/15 0747)  morphine 4 MG/ML injection 4 mg (4 mg Intravenous Given 01/01/15 0746)    Case d/w Dr. Emelda Fear, repeat CBC and do ultrasound. Await results and contact Dr. Despina Hidden with results  Signed out to Dr. Clarene Duke pending repeat CBC and ultrasound report      Hemorrhagic corpus luteum, left, with on going intraperitoneal bleeding  Serial H/H Observation and serial exam  Lazaro Arms, MD 01/01/2015 3:41 PM

## 2015-01-01 NOTE — ED Notes (Signed)
Patient transported to CT 

## 2015-01-01 NOTE — ED Notes (Signed)
Pt transported to US; will administer medication with pt return.

## 2015-01-01 NOTE — ED Notes (Signed)
Dr. Clarene DukeLittle gave ok for patient to eat. Meal try given.

## 2015-01-02 DIAGNOSIS — N831 Corpus luteum cyst of ovary, unspecified side: Secondary | ICD-10-CM | POA: Diagnosis present

## 2015-01-02 DIAGNOSIS — N83299 Other ovarian cyst, unspecified side: Secondary | ICD-10-CM | POA: Diagnosis not present

## 2015-01-02 LAB — HEMOGLOBIN AND HEMATOCRIT, BLOOD
HEMATOCRIT: 27.5 % — AB (ref 36.0–46.0)
HEMOGLOBIN: 8.8 g/dL — AB (ref 12.0–15.0)

## 2015-01-02 MED ORDER — OXYCODONE-ACETAMINOPHEN 5-325 MG PO TABS: 1.0000 | ORAL_TABLET | ORAL | Status: DC | PRN

## 2015-01-02 NOTE — Progress Notes (Signed)

## 2015-01-02 NOTE — Plan of Care (Signed)
Problem: Safety: Goal: Ability to remain free from injury will improve Outcome: Completed/Met Date Met:  01/02/15 Patient ambulates independently without any difficulty.  Problem: Pain Managment: Goal: General experience of comfort will improve Outcome: Completed/Met Date Met:  01/02/15 Good pain control on po Percocet.  Problem: Activity: Goal: Risk for activity intolerance will decrease Outcome: Completed/Met Date Met:  01/02/15 Tolerates ambulating to the Bathroom without any problems.

## 2015-01-02 NOTE — Discharge Instructions (Signed)
Ovarian Cyst An ovarian cyst is a fluid-filled sac that forms on an ovary. The ovaries are small organs that produce eggs in women. Various types of cysts can form on the ovaries. Most are not cancerous. Many do not cause problems, and they often go away on their own. Some may cause symptoms and require treatment. Common types of ovarian cysts include:  Functional cysts--These cysts may occur every month during the menstrual cycle. This is normal. The cysts usually go away with the next menstrual cycle if the woman does not get pregnant. Usually, there are no symptoms with a functional cyst.  Endometrioma cysts--These cysts form from the tissue that lines the uterus. They are also called "chocolate cysts" because they become filled with blood that turns brown. This type of cyst can cause pain in the lower abdomen during intercourse and with your menstrual period.  Cystadenoma cysts--This type develops from the cells on the outside of the ovary. These cysts can get very big and cause lower abdomen pain and pain with intercourse. This type of cyst can twist on itself, cut off its blood supply, and cause severe pain. It can also easily rupture and cause a lot of pain.  Dermoid cysts--This type of cyst is sometimes found in both ovaries. These cysts may contain different kinds of body tissue, such as skin, teeth, hair, or cartilage. They usually do not cause symptoms unless they get very big.  Theca lutein cysts--These cysts occur when too much of a certain hormone (human chorionic gonadotropin) is produced and overstimulates the ovaries to produce an egg. This is most common after procedures used to assist with the conception of a baby (in vitro fertilization). CAUSES   Fertility drugs can cause a condition in which multiple large cysts are formed on the ovaries. This is called ovarian hyperstimulation syndrome.  A condition called polycystic ovary syndrome can cause hormonal imbalances that can lead to  nonfunctional ovarian cysts. SIGNS AND SYMPTOMS  Many ovarian cysts do not cause symptoms. If symptoms are present, they may include:  Pelvic pain or pressure.  Pain in the lower abdomen.  Pain during sexual intercourse.  Increasing girth (swelling) of the abdomen.  Abnormal menstrual periods.  Increasing pain with menstrual periods.  Stopping having menstrual periods without being pregnant. DIAGNOSIS  These cysts are commonly found during a routine or annual pelvic exam. Tests may be ordered to find out more about the cyst. These tests may include:  Ultrasound.  X-ray of the pelvis.  CT scan.  MRI.  Blood tests. TREATMENT  Many ovarian cysts go away on their own without treatment. Your health care provider may want to check your cyst regularly for 2-3 months to see if it changes. For women in menopause, it is particularly important to monitor a cyst closely because of the higher rate of ovarian cancer in menopausal women. When treatment is needed, it may include any of the following:  A procedure to drain the cyst (aspiration). This may be done using a long needle and ultrasound. It can also be done through a laparoscopic procedure. This involves using a thin, lighted tube with a tiny camera on the end (laparoscope) inserted through a small incision.  Surgery to remove the whole cyst. This may be done using laparoscopic surgery or an open surgery involving a larger incision in the lower abdomen.  Hormone treatment or birth control pills. These methods are sometimes used to help dissolve a cyst. HOME CARE INSTRUCTIONS   Only take over-the-counter   or prescription medicines as directed by your health care provider.  Follow up with your health care provider as directed.  Get regular pelvic exams and Pap tests. SEEK MEDICAL CARE IF:   Your periods are late, irregular, or painful, or they stop.  Your pelvic pain or abdominal pain does not go away.  Your abdomen becomes  larger or swollen.  You have pressure on your bladder or trouble emptying your bladder completely.  You have pain during sexual intercourse.  You have feelings of fullness, pressure, or discomfort in your stomach.  You lose weight for no apparent reason.  You feel generally ill.  You become constipated.  You lose your appetite.  You develop acne.  You have an increase in body and facial hair.  You are gaining weight, without changing your exercise and eating habits.  You think you are pregnant. SEEK IMMEDIATE MEDICAL CARE IF:   You have increasing abdominal pain.  You feel sick to your stomach (nauseous), and you throw up (vomit).  You develop a fever that comes on suddenly.  You have abdominal pain during a bowel movement.  Your menstrual periods become heavier than usual. MAKE SURE YOU:  Understand these instructions.  Will watch your condition.  Will get help right away if you are not doing well or get worse.   This information is not intended to replace advice given to you by your health care provider. Make sure you discuss any questions you have with your health care provider.   Document Released: 12/18/2004 Document Revised: 12/23/2012 Document Reviewed: 08/25/2012 Elsevier Interactive Patient Education 2016 [employer]  

## 2015-01-02 NOTE — Discharge Summary (Signed)
Physician Discharge Summary  Patient ID: Veronica Castillo MRN: [id number] DOB/AGE: 1990-04-01 25 y.o.  Admit date: 01/01/2015 Discharge date: 01/02/2015  Admission Diagnoses: Hemorrhagic corpus luteum, with active hemorrhage  Discharge Diagnoses:  Active Problems:   Corpus luteum cyst hemorrhage   Hemorrhage of corpus luteum cyst   Discharged Condition: stable  Hospital Course: observation with serial H/H, stabilized  Consults: None  Significant Diagnostic Studies: labs:  and sonograms  Treatments: observation  Discharge Exam: Blood pressure 120/73, pulse 85, temperature 98.7 F (37.1 C), temperature source Oral, resp. rate 18, height 5\' 6"  (1.676 m), weight 180 lb (81.647 kg), last menstrual period 11/22/2014, SpO2 100 %. General appearance: alert, cooperative and no distress GI: soft, non-tender; bowel sounds normal; no masses,  no organomegaly  Disposition: 01-Home or Self Care  Discharge Instructions    Call MD for:  persistant nausea and vomiting    Complete by:  As directed      Call MD for:  severe uncontrolled pain    Complete by:  As directed      Call MD for:  temperature >100.4    Complete by:  As directed      Diet - low sodium heart healthy    Complete by:  As directed      Increase activity slowly    Complete by:  As directed      Sexual Activity Restrictions    Complete by:  As directed   No sex for 3 weeks            Medication List    TAKE these medications        oxyCODONE-acetaminophen 5-325 MG tablet  Commonly known as:  PERCOCET/ROXICET  Take 1-2 tablets by mouth every 4 (four) hours as needed for severe pain.           Follow-up Information    [facility]   Why:  follow up   Contact information:   [address] [place] [id number] [phone]      Signed: Lazaro ArmsURE,LUTHER H 01/02/2015, 7:47 AM

## 2015-01-04 LAB — GC/CHLAMYDIA PROBE AMP (~~LOC~~) NOT AT ARMC
Chlamydia: POSITIVE — AB
NEISSERIA GONORRHEA: NEGATIVE

## 2015-01-05 ENCOUNTER — Telehealth (HOSPITAL_BASED_OUTPATIENT_CLINIC_OR_DEPARTMENT_OTHER): Payer: Self-pay | Admitting: Emergency Medicine

## 2015-01-05 ENCOUNTER — Telehealth: Payer: Self-pay | Admitting: Obstetrics & Gynecology

## 2015-01-05 MED ORDER — AZITHROMYCIN 500 MG PO TABS: 1000.0000 mg | ORAL_TABLET | Freq: Once | ORAL | Status: DC

## 2015-01-05 NOTE — Telephone Encounter (Signed)
Pt informed of + Chlamydia and Zithromax e-scribed for pt and partner, no sex for 3 weeks, appt for POC 01/27/2015.

## 2015-01-05 NOTE — Telephone Encounter (Signed)
Pt was in hospital for ruptured corpus luteum of pregnancy  Chlamydia is positive  E prescribed 1 gram zithromax and refill for partner

## 2015-01-27 ENCOUNTER — Encounter: Payer: Self-pay | Admitting: Obstetrics & Gynecology

## 2015-01-28 ENCOUNTER — Encounter: Payer: Self-pay | Admitting: Obstetrics & Gynecology

## 2015-03-18 ENCOUNTER — Emergency Department (HOSPITAL_COMMUNITY)
Admission: EM | Admit: 2015-03-18 | Discharge: 2015-03-18 | Disposition: A | Discharge status: Home / Self Care | Payer: BLUE CROSS/BLUE SHIELD | Attending: Emergency Medicine | Admitting: Emergency Medicine

## 2015-03-18 ENCOUNTER — Encounter (HOSPITAL_COMMUNITY): Payer: Self-pay | Admitting: Emergency Medicine

## 2015-03-18 DIAGNOSIS — Z8742 Personal history of other diseases of the female genital tract: Secondary | ICD-10-CM | POA: Diagnosis not present

## 2015-03-18 DIAGNOSIS — Z79899 Other long term (current) drug therapy: Secondary | ICD-10-CM | POA: Diagnosis not present

## 2015-03-18 DIAGNOSIS — Z3202 Encounter for pregnancy test, result negative: Secondary | ICD-10-CM | POA: Diagnosis not present

## 2015-03-18 DIAGNOSIS — K529 Noninfective gastroenteritis and colitis, unspecified: Secondary | ICD-10-CM | POA: Insufficient documentation

## 2015-03-18 DIAGNOSIS — R197 Diarrhea, unspecified: Secondary | ICD-10-CM | POA: Diagnosis present

## 2015-03-18 DIAGNOSIS — D72829 Elevated white blood cell count, unspecified: Secondary | ICD-10-CM | POA: Diagnosis not present

## 2015-03-18 HISTORY — DX: Unspecified ovarian cyst, unspecified side: N83.209

## 2015-03-18 LAB — URINALYSIS, ROUTINE W REFLEX MICROSCOPIC
BILIRUBIN URINE: NEGATIVE
GLUCOSE, UA: NEGATIVE mg/dL
Hgb urine dipstick: NEGATIVE
Ketones, ur: NEGATIVE mg/dL
NITRITE: NEGATIVE
PH: 5.5 (ref 5.0–8.0)
Protein, ur: NEGATIVE mg/dL
Specific Gravity, Urine: 1.034 — ABNORMAL HIGH (ref 1.005–1.030)

## 2015-03-18 LAB — COMPREHENSIVE METABOLIC PANEL
ALBUMIN: 4 g/dL (ref 3.5–5.0)
ALT: 10 U/L — AB (ref 14–54)
AST: 18 U/L (ref 15–41)
Alkaline Phosphatase: 45 U/L (ref 38–126)
Anion gap: 9 (ref 5–15)
BILIRUBIN TOTAL: 0.6 mg/dL (ref 0.3–1.2)
BUN: 10 mg/dL (ref 6–20)
CO2: 22 mmol/L (ref 22–32)
Calcium: 9.5 mg/dL (ref 8.9–10.3)
Chloride: 107 mmol/L (ref 101–111)
Creatinine, Ser: 0.49 mg/dL (ref 0.44–1.00)
GFR calc Af Amer: >60 mL/min (ref >60)
GFR calc non Af Amer: >60 mL/min (ref >60)
GLUCOSE: 84 mg/dL (ref 65–99)
POTASSIUM: 4.2 mmol/L (ref 3.5–5.1)
Sodium: 138 mmol/L (ref 135–145)
TOTAL PROTEIN: 8.2 g/dL — AB (ref 6.5–8.1)

## 2015-03-18 LAB — LIPASE, BLOOD: Lipase: 30 U/L (ref 11–51)

## 2015-03-18 LAB — URINE MICROSCOPIC-ADD ON: RBC / HPF: NONE SEEN RBC/hpf (ref 0–5)

## 2015-03-18 LAB — I-STAT BETA HCG BLOOD, ED (MC, WL, AP ONLY): I-stat hCG, quantitative: <5 m[IU]/mL (ref <5)

## 2015-03-18 LAB — CBC
HEMATOCRIT: 40 % (ref 36.0–46.0)
Hemoglobin: 12.9 g/dL (ref 12.0–15.0)
MCH: 27.9 pg (ref 26.0–34.0)
MCHC: 32.3 g/dL (ref 30.0–36.0)
MCV: 86.4 fL (ref 78.0–100.0)
Platelets: 439 10*3/uL — ABNORMAL HIGH (ref 150–400)
RBC: 4.63 MIL/uL (ref 3.87–5.11)
RDW: 13 % (ref 11.5–15.5)
WBC: 15.3 10*3/uL — ABNORMAL HIGH (ref 4.0–10.5)

## 2015-03-18 MED ORDER — ONDANSETRON HCL 4 MG/2ML IJ SOLN
4.0000 mg | Freq: Once | INTRAMUSCULAR | Status: AC
  Administered 2015-03-18: 4 mg via INTRAVENOUS
  Filled 2015-03-18: qty 2

## 2015-03-18 MED ORDER — SODIUM CHLORIDE 0.9 % IV BOLUS (SEPSIS)
1000.0000 mL | Freq: Once | INTRAVENOUS | Status: AC
  Administered 2015-03-18: 1000 mL via INTRAVENOUS

## 2015-03-18 MED ORDER — ONDANSETRON 8 MG PO TBDP: ORAL_TABLET | ORAL | Status: DC

## 2015-03-18 NOTE — ED Provider Notes (Signed)
CSN: [id number]     Arrival date & time 03/18/15  1453 History   First MD Initiated Contact with Patient 03/18/15 2049     Chief Complaint  Patient presents with  . Abdominal Pain  . Emesis  . Diarrhea     (Consider location/radiation/quality/duration/timing/severity/associated sxs/prior Treatment) The history is provided by the patient, medical records and a relative. No language interpreter was used.     Veronica Castillo is a 25 y.o. female  with a hx of ovarian cyst presents to the Emergency Department complaining of gradual, persistent, progressively worsening nausea, vomiting and diarrhea onset 8am this morning.  Pt reports NBNB emesis and many bouts of diarrhea. Associated symptoms include Generalized abdominal cramping.  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  She reports last bout of emesis was noon today. No known aggravating or alleviating factors. Patient reports that her abdominal pain nausea vomiting and diarrhea have resolved at this time.  Uses paperwork from her primary care provider which shows a CBC with white blood cell count of 18.  Past Medical History  Diagnosis Date  . Ruptured ovarian cyst    History reviewed. No pertinent past surgical history. No family history on file. Social History  Substance Use Topics  . Smoking status: Never Smoker   . Smokeless tobacco: None  . Alcohol Use: No   OB History    No data available     Review of Systems  Constitutional: Negative for fever, diaphoresis, appetite change, fatigue and unexpected weight change.  HENT: Negative for mouth sores.   Eyes: Negative for visual disturbance.  Respiratory: Negative for cough, chest tightness, shortness of breath and wheezing.   Cardiovascular: Negative for chest pain.  Gastrointestinal: Positive for nausea, vomiting, abdominal pain and diarrhea. Negative for constipation.  Endocrine: Negative for polydipsia, polyphagia and  polyuria.  Genitourinary: Negative for dysuria, urgency, frequency and hematuria.  Musculoskeletal: Negative for back pain and neck stiffness.  Skin: Negative for rash.  Allergic/Immunologic: Negative for immunocompromised state.  Neurological: Negative for syncope, light-headedness and headaches.  Hematological: Does not bruise/bleed easily.  Psychiatric/Behavioral: Negative for sleep disturbance. The patient is not nervous/anxious.       Allergies  Review of patient's allergies indicates no known allergies.  Home Medications   Prior to Admission medications   Medication Sig Start Date End Date Taking? Authorizing Provider  APRI 0.15-30 MG-MCG tablet Take 1 tablet by mouth daily. 03/02/15  Yes Historical Provider, MD  ondansetron (ZOFRAN ODT) 8 MG disintegrating tablet 8mg  ODT q4 hours prn nausea 03/18/15   Zayon Trulson, PA-C   BP 124/86 mmHg  Pulse 73  Temp(Src) 98.4 F (36.9 C) (Oral)  Resp 20  SpO2 99%  LMP 03/01/2015 (Approximate) Physical Exam  Constitutional: She appears well-developed and well-nourished. No distress.  Awake, alert, nontoxic appearance  HENT:  Head: Normocephalic and atraumatic.  Mouth/Throat: Oropharynx is clear and moist. No oropharyngeal exudate.  Eyes: Conjunctivae are normal. No scleral icterus.  Neck: Normal range of motion. Neck supple.  Cardiovascular: Normal rate, regular rhythm, normal heart sounds and intact distal pulses.   No murmur heard. Pulmonary/Chest: Effort normal and breath sounds normal. No respiratory distress. She has no wheezes.  Equal chest expansion  Abdominal: Soft. Bowel sounds are normal. She exhibits no distension and no mass. There is no tenderness. There is no rebound and no guarding.  Musculoskeletal: Normal range of motion. She exhibits no edema.  Neurological: She is alert.  Speech is clear and goal oriented Moves extremities without ataxia  Skin: Skin is warm and dry. She is not diaphoretic.  Psychiatric:  She has a normal mood and affect.  Nursing note and vitals reviewed.   ED Course  Procedures (including critical care time) Labs Review Labs Reviewed  COMPREHENSIVE METABOLIC PANEL - Abnormal; Notable for the following:    Total Protein 8.2 (*)    ALT 10 (*)    All other components within normal limits  CBC - Abnormal; Notable for the following:    WBC 15.3 (*)    Platelets 439 (*)    All other components within normal limits  URINALYSIS, ROUTINE W REFLEX MICROSCOPIC (NOT AT [facility]) - Abnormal; Notable for the following:    APPearance CLOUDY (*)    Specific Gravity, Urine 1.034 (*)    Leukocytes, UA MODERATE (*)    All other components within normal limits  URINE MICROSCOPIC-ADD ON - Abnormal; Notable for the following:    Squamous Epithelial / LPF 6-30 (*)    Bacteria, UA FEW (*)    All other components within normal limits  LIPASE, BLOOD  I-STAT BETA HCG BLOOD, ED ([facility], [facility], [facility] ONLY)    MDM   Final diagnoses:  Gastroenteritis  Leukocytosis   Veronica Castillo presents with nausea, vomiting, diarrhea and leukocytosis.  Patient with symptoms consistent with gastritis.  Likely viral in nature.  Vitals are stable, no fever or tachycardia.  Patient is nontoxic, nonseptic appearing, in no apparent distress.  Patient does not meet the SIRS or Sepsis criteria.  Pt's symptoms have been managed in the department; fluid bolus given.  Patient presents paperwork which shows leukocytosis of 18 and CBC here in the emergency room shows a leukocytosis of 15.3.  No signs of dehydration, tolerating PO fluids > 6 oz.  Lungs are clear.  Abdomen is soft and nontender. No focal abdominal pain, no peritoneal signs, no concern for appendicitis, cholecystitis, pancreatitis, ruptured viscus, UTI, kidney stone, PID, ectopic pregnancy.  Supportive therapy indicated.  Patient and mother counseled, expresses understanding and agrees with plan.  Strict return precautions given including fever, worsening pain or  persistent vomiting.   BP 124/86 mmHg  Pulse 73  Temp(Src) 98.4 F (36.9 C) (Oral)  Resp 20  SpO2 99%  LMP 03/01/2015 (Approximate)     [facility], PA-C 03/18/15 2346  Vanetta Mulders, MD 03/19/15 1541

## 2015-03-18 NOTE — Discharge Instructions (Signed)
1. Medications: zofran, usual home medications °2. Treatment: rest, drink plenty of fluids, advance diet slowly °3. Follow Up: Please followup with your primary doctor in 2 days for discussion of your diagnoses and further evaluation after today's visit; if you do not have a primary care doctor use the resource guide provided to find one; Please return to the ER for persistent vomiting, high fevers or worsening symptoms ° ° ° °Viral Gastroenteritis °Viral gastroenteritis is also known as stomach flu. This condition affects the stomach and intestinal tract. It can cause sudden diarrhea and vomiting. The illness typically lasts 3 to 8 days. Most people develop an immune response that eventually gets rid of the virus. While this natural response develops, the virus can make you quite ill. °CAUSES  °Many different viruses can cause gastroenteritis, such as rotavirus or noroviruses. You can catch one of these viruses by consuming contaminated food or water. You may also catch a virus by sharing utensils or other personal items with an infected person or by touching a contaminated surface. °SYMPTOMS  °The most common symptoms are diarrhea and vomiting. These problems can cause a severe loss of body fluids (dehydration) and a body salt (electrolyte) imbalance. Other symptoms may include: °· Fever. °· Headache. °· Fatigue. °· Abdominal pain. °DIAGNOSIS  °Your caregiver can usually diagnose viral gastroenteritis based on your symptoms and a physical exam. A stool sample may also be taken to test for the presence of viruses or other infections. °TREATMENT  °This illness typically goes away on its own. Treatments are aimed at rehydration. The most serious cases of viral gastroenteritis involve vomiting so severely that you are not able to keep fluids down. In these cases, fluids must be given through an intravenous line (IV). °HOME CARE INSTRUCTIONS  °· Drink enough fluids to keep your urine clear or pale yellow. Drink small  amounts of fluids frequently and increase the amounts as tolerated. °· Ask your caregiver for specific rehydration instructions. °· Avoid: °¨ Foods high in sugar. °¨ Alcohol. °¨ Carbonated drinks. °¨ Tobacco. °¨ Juice. °¨ Caffeine drinks. °¨ Extremely hot or cold fluids. °¨ Fatty, greasy foods. °¨ Too much intake of anything at one time. °¨ Dairy products until 24 to 48 hours after diarrhea stops. °· You may consume probiotics. Probiotics are active cultures of beneficial bacteria. They may lessen the amount and number of diarrheal stools in adults. Probiotics can be found in yogurt with active cultures and in supplements. °· Wash your hands well to avoid spreading the virus. °· Only take over-the-counter or prescription medicines for pain, discomfort, or fever as directed by your caregiver. Do not give aspirin to children. Antidiarrheal medicines are not recommended. °· Ask your caregiver if you should continue to take your regular prescribed and over-the-counter medicines. °· Keep all follow-up appointments as directed by your caregiver. °SEEK IMMEDIATE MEDICAL CARE IF:  °· You are unable to keep fluids down. °· You do not urinate at least once every 6 to 8 hours. °· You develop shortness of breath. °· You notice blood in your stool or vomit. This may look like coffee grounds. °· You have abdominal pain that increases or is concentrated in one small area (localized). °· You have persistent vomiting or diarrhea. °· You have a fever. °· The patient is a child younger than 3 months, and he or she has a fever. °· The patient is a child older than 3 months, and he or she has a fever and persistent symptoms. °· The   patient is a child older than 3 months, and he or she has a fever and symptoms suddenly get worse. °· The patient is a baby, and he or she has no tears when crying. °MAKE SURE YOU:  °· Understand these instructions. °· Will watch your condition. °· Will get help right away if you are not doing well or get  worse. °  °This information is not intended to replace advice given to you by your health care provider. Make sure you discuss any questions you have with your health care provider. °  °Document Released: 12/18/2004 Document Revised: 03/12/2011 Document Reviewed: 10/04/2010 °Elsevier Interactive Patient Education ©2016 [employer] ° °

## 2015-03-18 NOTE — ED Notes (Signed)
[facility] being referred for a WBC of 18. Pt repost mid, lower abd pain, emesis earlier today and diarrhea. Pt is unable to keep fluids or medications down. Pt was offered Zofran in triage but refused stating that she is not nauseous at this time. Pt is A&O and ambulatory w/o difficulty

## 2016-10-29 ENCOUNTER — Encounter (HOSPITAL_COMMUNITY): Payer: Self-pay | Admitting: Emergency Medicine

## 2016-10-29 ENCOUNTER — Emergency Department (HOSPITAL_COMMUNITY)
Admission: EM | Admit: 2016-10-29 | Discharge: 2016-10-29 | Disposition: A | Discharge status: Home / Self Care | Payer: BLUE CROSS/BLUE SHIELD | Attending: Emergency Medicine | Admitting: Emergency Medicine

## 2016-10-29 DIAGNOSIS — H608X3 Other otitis externa, bilateral: Secondary | ICD-10-CM

## 2016-10-29 DIAGNOSIS — Z79899 Other long term (current) drug therapy: Secondary | ICD-10-CM | POA: Diagnosis not present

## 2016-10-29 DIAGNOSIS — H9203 Otalgia, bilateral: Secondary | ICD-10-CM | POA: Diagnosis present

## 2016-10-29 LAB — CBC WITH DIFFERENTIAL/PLATELET
BASOS PCT: 0 %
Basophils Absolute: 0 10*3/uL (ref 0.0–0.1)
Eosinophils Absolute: 0.1 10*3/uL (ref 0.0–0.7)
Eosinophils Relative: 1 %
HEMATOCRIT: 35 % — AB (ref 36.0–46.0)
Hemoglobin: 11.2 g/dL — ABNORMAL LOW (ref 12.0–15.0)
LYMPHS ABS: 1.4 10*3/uL (ref 0.7–4.0)
LYMPHS PCT: 11 %
MCH: 27.1 pg (ref 26.0–34.0)
MCHC: 32 g/dL (ref 30.0–36.0)
MCV: 84.5 fL (ref 78.0–100.0)
MONO ABS: 1.2 10*3/uL — AB (ref 0.1–1.0)
MONOS PCT: 10 %
NEUTROS ABS: 9.6 10*3/uL — AB (ref 1.7–7.7)
Neutrophils Relative %: 78 %
Platelets: 380 10*3/uL (ref 150–400)
RBC: 4.14 MIL/uL (ref 3.87–5.11)
RDW: 13.5 % (ref 11.5–15.5)
WBC: 12.3 10*3/uL — ABNORMAL HIGH (ref 4.0–10.5)

## 2016-10-29 LAB — BASIC METABOLIC PANEL
Anion gap: 8 (ref 5–15)
BUN: 7 mg/dL (ref 6–20)
CHLORIDE: 103 mmol/L (ref 101–111)
CO2: 23 mmol/L (ref 22–32)
CREATININE: 0.65 mg/dL (ref 0.44–1.00)
Calcium: 8.5 mg/dL — ABNORMAL LOW (ref 8.9–10.3)
GFR calc Af Amer: >60 mL/min (ref >60)
GFR calc non Af Amer: >60 mL/min (ref >60)
GLUCOSE: 118 mg/dL — AB (ref 65–99)
Potassium: 3.7 mmol/L (ref 3.5–5.1)
Sodium: 134 mmol/L — ABNORMAL LOW (ref 135–145)

## 2016-10-29 LAB — I-STAT CG4 LACTIC ACID, ED: Lactic Acid, Venous: 1.06 mmol/L (ref 0.5–1.9)

## 2016-10-29 MED ORDER — CIPROFLOXACIN-DEXAMETHASONE 0.3-0.1 % OT SUSP: 4.0000 [drp] | Freq: Two times a day (BID) | OTIC | 0 refills | Status: AC

## 2016-10-29 MED ORDER — ACETAMINOPHEN 325 MG PO TABS
ORAL_TABLET | ORAL | Status: AC
  Filled 2016-10-29: qty 2

## 2016-10-29 MED ORDER — ACETAMINOPHEN 325 MG PO TABS
650.0000 mg | ORAL_TABLET | Freq: Once | ORAL | Status: AC
Start: 2016-10-29 — End: 2016-10-29
  Administered 2016-10-29: 650 mg via ORAL

## 2016-10-29 NOTE — ED Triage Notes (Signed)
Pt c/o bilateral ear pain 9/10, fever and chills.

## 2016-10-29 NOTE — Discharge Instructions (Signed)
Please read instructions below. Discontinue using the neomycin ear drops. Begin using the prescribed drops, 4 drops per ear, 2 times per day for 7 days.  Begin taking advil/ibuprofen every 6 hours for pain and inflammation. If symptoms persist after you finish the drops, you can schedule an appointment ENT specialists.

## 2016-10-29 NOTE — ED Provider Notes (Signed)
[facility] EMERGENCY DEPARTMENT Provider Note   CSN: [id number] Arrival date & time: 10/29/16  0222     History   Chief Complaint Chief Complaint  Patient presents with  . Otalgia    HPI Veronica Castillo is a 26 y.o. female without significant PMHx, presenting to the ED with bilateral ear pain, right worse than left, that began on Friday. Pt was seen at her student [facility] clinic on Friday for right ear pain and prescribed neomycin-polymyxin drops. She states Saturday her left ear began bothering her. States ear pain radiates to her right jaw. Has taken tylenol without much relief of pain. Reports subjective fever. Reports today because symptoms persist. Denies sore throat, nasal congestion, cough, or any other complaints.  The history is provided by the patient.    Past Medical History:  Diagnosis Date  . Ruptured ovarian cyst     Patient Active Problem List   Diagnosis Date Noted  . Hemorrhage of corpus luteum cyst 01/02/2015  . Corpus luteum cyst hemorrhage 01/01/2015    History reviewed. No pertinent surgical history.  OB History    No data available       Home Medications    Prior to Admission medications   Medication Sig Start Date End Date Taking? Authorizing Provider  APRI 0.15-30 MG-MCG tablet Take 1 tablet by mouth daily. 03/02/15   [provider]  ciprofloxacin-dexamethasone (CIPRODEX) OTIC suspension Place 4 drops into both ears 2 (two) times daily. 10/29/16 11/05/16  Braylee Lal, [place] N, PA-C  ondansetron (ZOFRAN ODT) 8 MG disintegrating tablet 8mg  ODT q4 hours prn nausea 03/18/15   Muthersbaugh, Dahlia Client, PA-C    Family History No family history on file.  Social History Social History  Substance Use Topics  . Smoking status: Never Smoker  . Smokeless tobacco: Not on file  . Alcohol use No     Allergies   Patient has no known allergies.   Review of Systems Review of Systems  Constitutional: Positive for fever  (subjective).  HENT: Positive for ear pain. Negative for congestion, facial swelling, hearing loss, sore throat, trouble swallowing and voice change.   Respiratory: Negative for cough and shortness of breath.   Allergic/Immunologic: Negative for immunocompromised state.  All other systems reviewed and are negative.    Physical Exam Updated Vital Signs BP 131/85 (BP Location: Right Arm)   Pulse 99   Temp 99.1 F (37.3 C) (Oral)   Resp 14   Ht 5\' 6"  (1.676 m)   Wt 83.5 kg (184 lb)   LMP 09/28/2016   SpO2 98%   BMI 29.70 kg/m   Physical Exam  Constitutional: She appears well-developed and well-nourished. No distress.  HENT:  Head: Normocephalic and atraumatic.  Right Ear: Hearing, tympanic membrane and external ear normal. No mastoid tenderness.  Left Ear: Hearing, tympanic membrane and external ear normal. No mastoid tenderness.  Nose: Nose normal.  Mouth/Throat: Uvula is midline and oropharynx is clear and moist. No trismus in the jaw. No uvula swelling.  Right canal edematous and moist appearing, however canal is not occluded. No purulent drainage noted.  Left canal with mild edema and appears slightly moist. No mastoid erythema or tenderness.   Eyes: Conjunctivae are normal.  Neck: Normal range of motion. Neck supple. No tracheal deviation present.  Cardiovascular: Normal rate, regular rhythm, normal heart sounds and intact distal pulses.   Pulmonary/Chest: Effort normal and breath sounds normal. No stridor. No respiratory distress. She has no wheezes. She has no  rales.  Abdominal: Soft. Bowel sounds are normal. She exhibits no distension. There is no tenderness.  Lymphadenopathy:    She has no cervical adenopathy.  Psychiatric: She has a normal mood and affect. Her behavior is normal.  Nursing note and vitals reviewed.    ED Treatments / Results  Labs (all labs ordered are listed, but only abnormal results are displayed) Labs Reviewed  CBC WITH DIFFERENTIAL/PLATELET -  Abnormal; Notable for the following:       Result Value   WBC 12.3 (*)    Hemoglobin 11.2 (*)    HCT 35.0 (*)    Neutro Abs 9.6 (*)    Monocytes Absolute 1.2 (*)    All other components within normal limits  BASIC METABOLIC PANEL - Abnormal; Notable for the following:    Sodium 134 (*)    Glucose, Bld 118 (*)    Calcium 8.5 (*)    All other components within normal limits  I-STAT CG4 LACTIC ACID, ED    EKG  EKG Interpretation None       Radiology No results found.  Procedures Procedures (including critical care time)  Medications Ordered in ED Medications  acetaminophen (TYLENOL) 325 MG tablet (not administered)  acetaminophen (TYLENOL) tablet 650 mg (650 mg Oral Given 10/29/16 0244)     Initial Impression / Assessment and Plan / ED Course  I have reviewed the triage vital signs and the nursing notes.  Pertinent labs & imaging results that were available during my care of the patient were reviewed by me and considered in my medical decision making (see chart for details).     Pt presenting with otitis externa. No canal occlusion, Pt afebrile in NAD. Exam non concerning for mastoiditis, cellulitis or malignant OE. Will discontinue neomycin drops and start ciprodex and also recommend NSAID for symptoms. ENT referral given as needed if symptoms persist.  Discussed results, findings, treatment and follow up. Patient advised of return precautions. Patient verbalized understanding and agreed with plan.  Final Clinical Impressions(s) / ED Diagnoses   Final diagnoses:  Other otitis externa, bilateral    New Prescriptions New Prescriptions   CIPROFLOXACIN-DEXAMETHASONE (CIPRODEX) OTIC SUSPENSION    Place 4 drops into both ears 2 (two) times daily.     Mikenzi Raysor, [place] N, PA-C 10/29/16 0410    Devoria AlbeKnapp, Iva, MD 10/29/16 [phone]

## 2017-02-04 ENCOUNTER — Other Ambulatory Visit: Payer: Self-pay

## 2017-02-04 ENCOUNTER — Encounter: Payer: Self-pay | Admitting: Emergency Medicine

## 2017-02-04 DIAGNOSIS — Z79899 Other long term (current) drug therapy: Secondary | ICD-10-CM | POA: Insufficient documentation

## 2017-02-04 DIAGNOSIS — K0889 Other specified disorders of teeth and supporting structures: Secondary | ICD-10-CM | POA: Insufficient documentation

## 2017-02-04 NOTE — ED Triage Notes (Signed)
Patient ambulatory to triage with steady gait, without difficulty or distress noted; pt reports left lower dental pain since yesterday 

## 2017-02-05 ENCOUNTER — Emergency Department
Admission: EM | Admit: 2017-02-05 | Discharge: 2017-02-05 | Disposition: A | Discharge status: Home / Self Care | Payer: BLUE CROSS/BLUE SHIELD | Attending: Emergency Medicine | Admitting: Emergency Medicine

## 2017-02-05 DIAGNOSIS — K0889 Other specified disorders of teeth and supporting structures: Secondary | ICD-10-CM

## 2017-02-05 MED ORDER — CEPHALEXIN 500 MG PO CAPS
500.0000 mg | ORAL_CAPSULE | Freq: Once | ORAL | Status: AC
  Administered 2017-02-05: 500 mg via ORAL

## 2017-02-05 MED ORDER — TRAMADOL HCL 50 MG PO TABS
ORAL_TABLET | ORAL | Status: AC
  Administered 2017-02-05: 50 mg via ORAL
  Filled 2017-02-05: qty 1

## 2017-02-05 MED ORDER — TRAMADOL HCL 50 MG PO TABS
50.0000 mg | ORAL_TABLET | Freq: Once | ORAL | Status: AC
  Administered 2017-02-05: 50 mg via ORAL

## 2017-02-05 MED ORDER — LIDOCAINE VISCOUS 2 % MT SOLN
15.0000 mL | Freq: Once | OROMUCOSAL | Status: AC
Start: 2017-02-05 — End: 2017-02-05
  Administered 2017-02-05: 15 mL via OROMUCOSAL

## 2017-02-05 MED ORDER — LIDOCAINE VISCOUS 2 % MT SOLN
OROMUCOSAL | Status: AC
  Filled 2017-02-05: qty 15

## 2017-02-05 MED ORDER — TRAMADOL HCL 50 MG PO TABS: 50.0000 mg | ORAL_TABLET | Freq: Four times a day (QID) | ORAL | 0 refills | Status: DC | PRN

## 2017-02-05 MED ORDER — LIDOCAINE VISCOUS 2 % MT SOLN: 20.0000 mL | OROMUCOSAL | 0 refills | Status: DC | PRN

## 2017-02-05 MED ORDER — CEPHALEXIN 500 MG PO CAPS
ORAL_CAPSULE | ORAL | Status: AC
  Administered 2017-02-05: 500 mg via ORAL
  Filled 2017-02-05: qty 1

## 2017-02-05 MED ORDER — CEPHALEXIN 500 MG PO CAPS: 500.0000 mg | ORAL_CAPSULE | Freq: Four times a day (QID) | ORAL | 0 refills | Status: AC

## 2017-02-05 NOTE — Discharge Instructions (Signed)
Please follow up with your dentist to have your wisdom teeth removed.

## 2017-02-05 NOTE — ED Notes (Signed)
Waiting on med to be verified by pharmacy

## 2017-02-05 NOTE — ED Provider Notes (Signed)
[facility] Emergency Department Provider Note  ____________________________________________   First MD Initiated Contact with Patient 02/05/17 0036     (approximate)  I have reviewed the triage vital signs and the nursing notes.   HISTORY  Chief Complaint Dental Pain    HPI Veronica Castillo is a 27 y.o. female who comes into the hospital today with some wisdom tooth pain.  She reports that it is pain on the left side.  It is throbbing and she is unable to bite down.  This is been going on for the past 2 days.  The patient has been taking ibuprofen.  She states that she called her dentist and they do not have an appointment until Friday but she could not tolerate the pain so she came into the hospital for evaluation.  The patient states that her pain is a 9 out of 10 in intensity currently.  She has had no fevers and some mild left-sided facial swelling.   Past Medical History:  Diagnosis Date  . Ruptured ovarian cyst     Patient Active Problem List   Diagnosis Date Noted  . Hemorrhage of corpus luteum cyst 01/02/2015  . Corpus luteum cyst hemorrhage 01/01/2015    History reviewed. No pertinent surgical history.  Prior to Admission medications   Medication Sig Start Date End Date Taking? Authorizing Provider  APRI 0.15-30 MG-MCG tablet Take 1 tablet by mouth daily. 03/02/15   [provider]  cephALEXin (KEFLEX) 500 MG capsule Take 1 capsule (500 mg total) by mouth 4 (four) times daily for 5 days. 02/05/17 02/10/17  Rebecka ApleyWebster, Allison P, MD  lidocaine (XYLOCAINE) 2 % solution Use as directed 20 mLs in the mouth or throat as needed for mouth pain. 02/05/17   Rebecka ApleyWebster, Allison P, MD  ondansetron (ZOFRAN ODT) 8 MG disintegrating tablet 8mg  ODT q4 hours prn nausea 03/18/15   Muthersbaugh, Dahlia ClientHannah, PA-C  traMADol (ULTRAM) 50 MG tablet Take 1 tablet (50 mg total) by mouth every 6 (six) hours as needed. 02/05/17   Rebecka ApleyWebster, Allison P, MD    Allergies Patient has  no known allergies.  No family history on file.  Social History Social History   Tobacco Use  . Smoking status: Never Smoker  . Smokeless tobacco: Never Used  Substance Use Topics  . Alcohol use: No  . Drug use: No    Review of Systems  Constitutional: No fever/chills Eyes: No visual changes. ENT: dental pain Cardiovascular: Denies chest pain. Respiratory: Denies shortness of breath. Gastrointestinal: No abdominal pain.  No nausea, no vomiting.  No diarrhea.  No constipation. Genitourinary: Negative for dysuria. Musculoskeletal: Negative for back pain. Skin: Negative for rash. Neurological: Negative for headaches, focal weakness or numbness.   ____________________________________________   PHYSICAL EXAM:  VITAL SIGNS: ED Triage Vitals  Enc Vitals Group     BP 02/04/17 2302 129/88     Pulse Rate 02/04/17 2302 79     Resp 02/04/17 2302 18     Temp 02/04/17 2302 98.8 F (37.1 C)     Temp Source 02/04/17 2302 Oral     SpO2 02/04/17 2302 100 %     Weight 02/04/17 2303 180 lb (81.6 kg)     Height 02/04/17 2303 5\' 6"  (1.676 m)     Head Circumference --      Peak Flow --      Pain Score 02/04/17 2302 9     Pain Loc --      Pain Edu? --  Excl. in GC? --     Constitutional: Alert and oriented. Well appearing and in moderate distress. Eyes: Conjunctivae are normal. PERRL. EOMI. Head: Atraumatic. Nose: No congestion/rhinnorhea. Mouth/Throat: Mucous membranes are moist.  Oropharynx non-erythematous. Impacted wisdom tooth on the left with some tenderness to palpation, no palpable abscess noted.  Cardiovascular: Normal rate, regular rhythm. Grossly normal heart sounds.  Good peripheral circulation. Respiratory: Normal respiratory effort.  No retractions. Lungs CTAB. Gastrointestinal: Soft and nontender. No distention. Positive bowel sounds Musculoskeletal: No lower extremity tenderness nor edema.   Neurologic:  Normal speech and language.  Skin:  Skin is warm, dry  and intact.  Psychiatric: Mood and affect are normal. Speech and behavior are normal.  ____________________________________________   LABS (all labs ordered are listed, but only abnormal results are displayed)  Labs Reviewed - No data to display ____________________________________________  EKG  none ____________________________________________  RADIOLOGY  ED MD interpretation:  none  Official radiology report(s): No results found.  ____________________________________________   PROCEDURES  Procedure(s) performed: None  Procedures  Critical Care performed: No  ____________________________________________   INITIAL IMPRESSION / ASSESSMENT AND PLAN / ED COURSE  As part of my medical decision making, I reviewed the following data within the electronic MEDICAL RECORD NUMBER Notes from prior ED visits and [place] Controlled Substance Database   This is a 27 year old female who comes into the hospital today with some dental pain.  The patient is having some pain near her left wisdom tooth.  The tooth does appear impacted.  I did give the patient a dose of viscous lidocaine as well as tramadol and Keflex.  The patient appears to need her wisdom teeth removed as again they are impacted.  The patient states that she will follow-up with her dentist but she does need to see an oral surgeon.  The patient will be discharged home to follow-up with her dentist.      ____________________________________________   FINAL CLINICAL IMPRESSION(S) / ED DIAGNOSES  Final diagnoses:  Pain, dental     ED Discharge Orders        Ordered    traMADol (ULTRAM) 50 MG tablet  Every 6 hours PRN     02/05/17 0101    lidocaine (XYLOCAINE) 2 % solution  As needed     02/05/17 0101    cephALEXin (KEFLEX) 500 MG capsule  4 times daily     02/05/17 0101       Note:  This document was prepared using Dragon voice recognition software and may include unintentional dictation errors.    Rebecka Apley, MD 02/05/17 [phone]

## 2018-02-06 ENCOUNTER — Ambulatory Visit: Payer: Self-pay | Admitting: Neurology

## 2019-07-11 ENCOUNTER — Ambulatory Visit: Payer: Self-pay

## 2019-07-11 ENCOUNTER — Ambulatory Visit: Payer: Self-pay | Attending: Internal Medicine

## 2019-07-11 DIAGNOSIS — Z23 Encounter for immunization: Secondary | ICD-10-CM

## 2019-07-11 NOTE — Progress Notes (Signed)
   Covid-19 Vaccination Clinic  Name:  Veronica Castillo    MRN: [id number] DOB: Dec 23, 1990  07/11/2019  [name] was observed post Covid-19 immunization for 15 minutes without incident. She was provided with Vaccine Information Sheet and instruction to access the V-Safe system.   [name] was instructed to call 911 with any severe reactions post vaccine: Marland Kitchen Difficulty breathing  . Swelling of face and throat  . A fast heartbeat  . A bad rash all over body  . Dizziness and weakness   Immunizations Administered    Name Date Dose VIS Date Route   Pfizer COVID-19 Vaccine 07/11/2019  8:06 AM 0.3 mL 02/25/2018 Intramuscular   Manufacturer: [employer], [employer]   Lot: [id number]   NDC: [address]-8

## 2019-07-28 ENCOUNTER — Ambulatory Visit: Payer: Self-pay | Attending: Internal Medicine

## 2019-07-28 DIAGNOSIS — Z23 Encounter for immunization: Secondary | ICD-10-CM

## 2019-07-28 NOTE — Progress Notes (Signed)
   Covid-19 Vaccination Clinic  Name:  OCTIVIA CANION    MRN: [id number] DOB: [birth year]  07/28/2019  Ms. Tebbetts was observed post Covid-19 immunization for 15 minutes without incident. She was provided with Vaccine Information Sheet and instruction to access the V-Safe system.   Ms. Fregeau was instructed to call 911 with any severe reactions post vaccine: Marland Kitchen Difficulty breathing  . Swelling of face and throat  . A fast heartbeat  . A bad rash all over body  . Dizziness and weakness   Immunizations Administered    Name Date Dose VIS Date Route   Pfizer COVID-19 Vaccine 07/28/2019  3:45 PM 0.3 mL 02/25/2018 Intramuscular   Manufacturer: [employer], [employer]   Lot: [id number]   NDC: [address]-7

## 2020-01-26 ENCOUNTER — Other Ambulatory Visit: Payer: Self-pay

## 2020-01-26 ENCOUNTER — Ambulatory Visit (LOCAL_COMMUNITY_HEALTH_CENTER): Payer: Self-pay

## 2020-01-26 VITALS — BP 139/93 | Ht 66.0 in | Wt 169.0 lb

## 2020-01-26 DIAGNOSIS — Z3201 Encounter for pregnancy test, result positive: Secondary | ICD-10-CM

## 2020-01-26 LAB — PREGNANCY, URINE: Preg Test, Ur: POSITIVE — AB

## 2020-01-26 MED ORDER — PRENATAL 27-0.8 MG PO TABS: 1.0000 | ORAL_TABLET | Freq: Every day | ORAL | 0 refills | Status: AC

## 2020-01-26 NOTE — Progress Notes (Signed)
UPT  Positive. Plans prenatal care at [facility]. BP elevated today. Pt explains HBP today d/t nervousness. No hx HBP or HBP meds. Reports spotting since yesterday. Consult C. Rolley Sims, [place] who advises pt to seek immediate medical attention/go to ER if bleeding increases and/or accompanied with increase pain. Advises pt to establish prenatal care. RN explained provider recommendations. Pt in agreement. To clerk for preadmit. Jerel Shepherd, RN

## 2020-01-26 NOTE — Progress Notes (Signed)
Consulted by RN re: patient situation. Rec that RN complete pregnancy test visit per protocol including warning signs for patient to seek care at ER.  Reviewed RN note and agree that it reflects our discussion and my recommendations.

## 2020-02-11 NOTE — Progress Notes (Signed)
Chart abstracted 02/10/2020 per history obtained via phone by Henriette Combs RN. Jossie Ng, RN

## 2020-02-15 ENCOUNTER — Telehealth: Payer: Self-pay

## 2020-02-15 ENCOUNTER — Other Ambulatory Visit: Payer: Self-pay | Admitting: Advanced Practice Midwife

## 2020-02-15 ENCOUNTER — Encounter: Payer: Self-pay | Admitting: Advanced Practice Midwife

## 2020-02-15 ENCOUNTER — Other Ambulatory Visit: Payer: Self-pay

## 2020-02-15 ENCOUNTER — Ambulatory Visit: Payer: BLUE CROSS/BLUE SHIELD | Admitting: Advanced Practice Midwife

## 2020-02-15 DIAGNOSIS — U071 COVID-19: Secondary | ICD-10-CM

## 2020-02-15 DIAGNOSIS — O161 Unspecified maternal hypertension, first trimester: Secondary | ICD-10-CM | POA: Insufficient documentation

## 2020-02-15 DIAGNOSIS — O0991 Supervision of high risk pregnancy, unspecified, first trimester: Secondary | ICD-10-CM | POA: Diagnosis not present

## 2020-02-15 DIAGNOSIS — O0993 Supervision of high risk pregnancy, unspecified, third trimester: Secondary | ICD-10-CM | POA: Insufficient documentation

## 2020-02-15 HISTORY — DX: COVID-19: U07.1

## 2020-02-15 LAB — URINALYSIS
Bilirubin, UA: NEGATIVE
Glucose, UA: NEGATIVE
Leukocytes,UA: NEGATIVE
Nitrite, UA: NEGATIVE
Protein,UA: NEGATIVE
RBC, UA: NEGATIVE
Specific Gravity, UA: 1.03 (ref 1.005–1.030)
Urobilinogen, Ur: 0.2 mg/dL (ref 0.2–1.0)
pH, UA: 6 (ref 5.0–7.5)

## 2020-02-15 LAB — WET PREP FOR TRICH, YEAST, CLUE
Trichomonas Exam: NEGATIVE
Yeast Exam: NEGATIVE

## 2020-02-15 LAB — HEMOGLOBIN, FINGERSTICK: Hemoglobin: 11.6 g/dL (ref 11.1–15.9)

## 2020-02-15 NOTE — Telephone Encounter (Signed)
Please refer to open phone encounter. Jossie Ng, RN

## 2020-02-15 NOTE — Progress Notes (Signed)
[facility] HEALTH DEPT [facility] [address] [place] [address] [phone]  INITIAL PRENATAL VISIT NOTE  Subjective:  Veronica Castillo is a 30 y.o. SBF  G1P0000 nonsmoker at [redacted]w[redacted]d being seen today to start prenatal care at the [facility]. She feels "excited " about surprise pregnancy with no birth control.  28 yo FOB feels "excited" about pregnancy; he has a 21 yo daughter who lives with her mom; in supportive 2 year relationship with employed FOB.  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  Living with FOB.  Has not had u/s or been to ER this pregnancy.  Denies cigs, vaping, cigar hx.  Last MJ 12/21/19.  Last ETOH 12/21/19 (3 shots Tequila).  LMP 11/29/19. Wants FIRST screen.  [facility] pap 2014-2015 at [facility].  She is currently monitored for the following issues for this high-risk pregnancy and has Corpus luteum cyst hemorrhage; Hemorrhage of corpus luteum cyst; Elevated blood pressure affecting pregnancy in first trimester, antepartum; Supervision of high risk pregnancy in first trimester; and COVID-19 11/2019 on their problem list.  Patient reports no complaints.  Contractions: Not present. Vag. Bleeding: None.  Movement: Absent. Denies leaking of fluid.   Indications for ASA therapy (per uptodate) One of the following: Previous pregnancy with preeclampsia, especially early onset and with an adverse outcome No Multifetal gestation No Chronic hypertension No Type 1 or 2 diabetes mellitus No Chronic kidney disease No Autoimmune disease (antiphospholipid syndrome, systemic lupus erythematosus) No  Two or more of the following: Nulliparity Yes Obesity (body mass index >30 kg/m2) No Family history of preeclampsia in mother or sister No Age ?35 years No Sociodemographic  characteristics (African American race, low socioeconomic level) Yes Personal risk factors (eg, previous pregnancy with low birth weight or small for gestational age infant, previous adverse pregnancy outcome [eg, stillbirth], interval >10 years between pregnancies) No   The following portions of the patient's history were reviewed and updated as appropriate: allergies, current medications, past family history, past medical history, past social history, past surgical history and problem list. Problem list updated.  Objective:   Vitals:   02/15/20 0905  BP: 134/86  Pulse: 89  Temp: 97.8 F (36.6 C)  Weight: 167 lb 6.4 oz (75.9 kg)    Fetal Status:     Movement: Absent      Physical Exam Vitals and nursing note reviewed.  Constitutional:      General: She is not in acute distress.    Appearance: Normal appearance. She is well-developed.  HENT:     Head: Normocephalic and atraumatic.     Right Ear: External ear normal.     Left Ear: External ear normal.     Nose: Nose normal. No congestion or rhinorrhea.     Mouth/Throat:     Lips: Pink.     Mouth: Mucous membranes are moist.     Dentition: Normal dentition. No dental caries.     Pharynx: Oropharynx is clear. Uvula midline.  Eyes:     General: No scleral icterus.    Conjunctiva/sclera: Conjunctivae normal.  Neck:     Thyroid: No thyroid mass or thyromegaly.     Comments: Thyroid without masses or enlargement Cardiovascular:     Rate and Rhythm: Normal rate.     Pulses: Normal pulses.  Comments: Extremities are warm and well perfused Pulmonary:     Effort: Pulmonary effort is normal.     Breath sounds: Normal breath sounds.  Chest:     Chest wall: No mass.  Breasts:     Tanner Score is 5. Breasts are symmetrical.     Right: Normal. No mass, nipple discharge, skin change or axillary adenopathy.     Left: Normal. No mass, nipple discharge, skin change or axillary adenopathy.      Comments: Everted nipples; desires  to breastfeed Abdominal:     Palpations: Abdomen is soft.     Tenderness: There is no abdominal tenderness.     Comments: Gravid, uterus 11-12 wk size, FHR=160  Genitourinary:    General: Normal vulva.     Exam position: Lithotomy position.     Pubic Area: No rash.      Labia:        Right: No rash.        Left: No rash.      Vagina: Vaginal discharge (frothy grey leukorrhea, ph>4.5) present.     Cervix: Friability (friable to pap) present. No cervical motion tenderness.     Uterus: Normal. Enlarged (Gravid 10-12 wks size). Not tender.      Adnexa: Right adnexa normal and left adnexa normal.     Rectum: Normal. No external hemorrhoid.  Musculoskeletal:     Cervical back: Normal range of motion and neck supple.     Right lower leg: No edema.     Left lower leg: No edema.  Lymphadenopathy:     Upper Body:     Right upper body: No axillary adenopathy.     Left upper body: No axillary adenopathy.  Skin:    General: Skin is warm.     Capillary Refill: Capillary refill takes less than 2 seconds.  Neurological:     Mental Status: She is alert.     Assessment and Plan:  Pregnancy: G1P0000 at [redacted]w[redacted]d  1. Elevated blood pressure affecting pregnancy in first trimester, antepartum 11/23/12=140/90, 01/26/20=139/93, 02/15/20=134/86 Pt chooses to take ASA 81 mg daily beginning next week PIH panel for baseline today  2. Supervision of high risk pregnancy in first trimester Desires FIRST screen--order placed MFM [facility] Immunization nurse consult Pt counseled on wt gain of 15-25 lb this pregnancy - Urine Culture - Varicella zoster antibody, IgG - Chlamydia/GC NAA, Confirmation - HIV-1/HIV-2 Qualitative RNA - HCV Ab w Reflex to Quant PCR - Hemoglobinopathy evaluation -[id number] - Prenatal profile without Varicella/Rubella ([id number]) - Hgb A1c w/o eAG - TSH - [id number] Drug Screen - PIH Panel ([facility] [id number]) - WET PREP FOR TRICH, YEAST, CLUE - Urinalysis (Urine Dip) - Hemoglobin,  venipuncture - [place] MFM OB COMPLETE LESS THAN 14 WEEKS; Future - IGP, rfx Aptima HPV ASCU  3. COVID-19 11/2019 Has had 2 vaccines    Discussed overview of care and coordination with inpatient delivery practices including [facility], [facility], [facility] and [facility].   Reviewed Centering pregnancy as standard of care at [facility], oriented to room and showed video. Based on EDD, plan for Cycle    Preterm labor symptoms and general obstetric precautions including but not limited to vaginal bleeding, contractions, leaking of fluid and fetal movement were reviewed in detail with the patient.  Please refer to After Visit Summary for other counseling recommendations.   Return in about 4 weeks (around 03/14/2020).  No future appointments.  Alberteen Spindle, CNM

## 2020-02-15 NOTE — Progress Notes (Signed)
Presents for initiation of prenatal care and taking PNV QD. Counseled regarding recommendation for flu vaccine. Declines flu vaccine and Covid booster (completed Pfizer Covid vaccine 07/2019. Jossie Ng, RN  Urine dip with trace ketones, otherwise WNL, Hgb and urine dip reviewed - no interventions needed per standing order. Jossie Ng, RN

## 2020-02-15 NOTE — Telephone Encounter (Signed)
Call from Clydie Braun MFM scheduler, and first trimester screen scheduled for 02/23/2020 at 12 noon (genetics) and 1 pm ([place]). Client notified of appt via phone call by RN. Client needs note for work regarding am appt today. Note completed and available for pick up at information book. Per client, she will pick up today before 5 pm. Jossie Ng, RN

## 2020-02-17 LAB — CBC/D/PLT+RPR+RH+ABO+AB SCR
Antibody Screen: NEGATIVE
Basophils Absolute: 0 10*3/uL (ref 0.0–0.2)
Basos: 1 %
EOS (ABSOLUTE): 0.1 10*3/uL (ref 0.0–0.4)
Eos: 1 %
Hematocrit: 34.6 % (ref 34.0–46.6)
Hemoglobin: 11.5 g/dL (ref 11.1–15.9)
Hepatitis B Surface Ag: NEGATIVE
Immature Grans (Abs): 0 10*3/uL (ref 0.0–0.1)
Immature Granulocytes: 0 %
Lymphocytes Absolute: 1.4 10*3/uL (ref 0.7–3.1)
Lymphs: 16 %
MCH: 28.4 pg (ref 26.6–33.0)
MCHC: 33.2 g/dL (ref 31.5–35.7)
MCV: 85 fL (ref 79–97)
Monocytes Absolute: 0.6 10*3/uL (ref 0.1–0.9)
Monocytes: 7 %
Neutrophils Absolute: 6.7 10*3/uL (ref 1.4–7.0)
Neutrophils: 75 %
Platelets: 405 10*3/uL (ref 150–450)
RBC: 4.05 x10E6/uL (ref 3.77–5.28)
RDW: 12.9 % (ref 11.7–15.4)
RPR Ser Ql: NONREACTIVE
Rh Factor: POSITIVE
WBC: 8.8 10*3/uL (ref 3.4–10.8)

## 2020-02-17 LAB — AST+BUN+CREAT+LD+URIC A+HGB...
AST: 13 IU/L (ref 0–40)
BUN: 7 mg/dL (ref 6–20)
Creatinine, Ser: 0.49 mg/dL — ABNORMAL LOW (ref 0.57–1.00)
GFR calc Af Amer: 154 mL/min/{1.73_m2} (ref >59)
GFR calc non Af Amer: 134 mL/min/{1.73_m2} (ref >59)
LDH: 167 IU/L (ref 119–226)
Uric Acid: 3.7 mg/dL (ref 2.6–6.2)

## 2020-02-17 LAB — IGP, RFX APTIMA HPV ASCU: PAP Smear Comment: 0

## 2020-02-17 LAB — CHLAMYDIA/GC NAA, CONFIRMATION
Chlamydia trachomatis, NAA: NEGATIVE
Neisseria gonorrhoeae, NAA: NEGATIVE

## 2020-02-17 LAB — HGB FRACTIONATION CASCADE
Hgb A2: 2.8 % (ref 1.8–3.2)
Hgb A: 97.2 % (ref 96.4–98.8)
Hgb F: 0 % (ref 0.0–2.0)
Hgb S: 0 %

## 2020-02-17 LAB — HCV INTERPRETATION

## 2020-02-17 LAB — HIV-1/HIV-2 QUALITATIVE RNA
HIV-1 RNA, Qualitative: NONREACTIVE
HIV-2 RNA, Qualitative: NONREACTIVE

## 2020-02-17 LAB — URINE CULTURE

## 2020-02-17 LAB — HCV AB W REFLEX TO QUANT PCR: HCV Ab: <0.1 s/co ratio (ref 0.0–0.9)

## 2020-02-17 LAB — TSH: TSH: 0.588 u[IU]/mL (ref 0.450–4.500)

## 2020-02-17 LAB — VARICELLA ZOSTER ANTIBODY, IGG: Varicella zoster IgG: 1114 index (ref >165)

## 2020-02-17 LAB — HGB A1C W/O EAG: Hgb A1c MFr Bld: 4.7 % — ABNORMAL LOW (ref 4.8–5.6)

## 2020-02-23 ENCOUNTER — Ambulatory Visit: Payer: BLUE CROSS/BLUE SHIELD | Attending: Obstetrics and Gynecology

## 2020-02-23 ENCOUNTER — Other Ambulatory Visit: Payer: Self-pay

## 2020-02-23 ENCOUNTER — Encounter: Payer: Self-pay | Admitting: Advanced Practice Midwife

## 2020-02-23 ENCOUNTER — Ambulatory Visit (HOSPITAL_BASED_OUTPATIENT_CLINIC_OR_DEPARTMENT_OTHER): Payer: BLUE CROSS/BLUE SHIELD

## 2020-02-23 DIAGNOSIS — O0991 Supervision of high risk pregnancy, unspecified, first trimester: Secondary | ICD-10-CM | POA: Diagnosis not present

## 2020-02-23 DIAGNOSIS — O26891 Other specified pregnancy related conditions, first trimester: Secondary | ICD-10-CM | POA: Diagnosis present

## 2020-02-23 DIAGNOSIS — Z3A12 12 weeks gestation of pregnancy: Secondary | ICD-10-CM | POA: Diagnosis not present

## 2020-02-23 DIAGNOSIS — Z3689 Encounter for other specified antenatal screening: Secondary | ICD-10-CM | POA: Diagnosis not present

## 2020-02-23 DIAGNOSIS — Z36 Encounter for antenatal screening for chromosomal anomalies: Secondary | ICD-10-CM

## 2020-02-23 DIAGNOSIS — Z315 Encounter for genetic counseling: Secondary | ICD-10-CM | POA: Insufficient documentation

## 2020-02-23 DIAGNOSIS — O131 Gestational [pregnancy-induced] hypertension without significant proteinuria, first trimester: Secondary | ICD-10-CM | POA: Insufficient documentation

## 2020-02-23 NOTE — Progress Notes (Signed)
Referring physican:  [facility] Length of Consultation: 25 minutes   Veronica Castillo  was referred to [facility] at [facility] for genetic counseling to review prenatal screening and testing options.  This note summarizes the information we discussed.    We offered the following routine screening tests for this pregnancy:  Cell free fetal DNA testing from maternal blood may be used to determine whether a baby is at high risk for Down syndrome, trisomy 61, or trisomy 74.  This test utilizes a maternal blood sample and DNA sequencing technology to isolate circulating cell free fetal DNA from maternal plasma.  The fetal DNA can then be analyzed for DNA sequences that are derived from the three most common chromosomes involved in aneuploidy, chromosomes 13, 18, and 21.  If the overall amount of DNA is greater than the expected level for any of these chromosomes, aneuploidy is suspected.  The detection rate for Down syndrome and trisomy 18 is >99% and the detection rate for trisomy 13 is >91%. While we do not consider it a replacement for invasive testing and karyotype analysis, a negative result from this testing would be reassuring, though not a guarantee of a normal chromosome complement for the baby.  An abnormal result is certainly suggestive of an abnormal chromosome complement, though we would still recommend CVS or amniocentesis to confirm any findings from this testing. This testing can also assess for the sex chromosomes and can detect approximately 96% of sex chromosome aneuploidies and determine fetal gender with >99% confidence.    First trimester screening, which includes nuchal translucency ultrasound screen and first trimester maternal serum marker screening.  The nuchal translucency has approximately an 80% detection rate for Down syndrome and can be positive for other chromosome abnormalities as well as congenital heart defects.  When combined with a maternal  serum marker screening, the detection rate is up to 90% for Down syndrome and up to 97% for trisomy 18.     Maternal serum marker screening, a blood test that measures pregnancy proteins, can provide risk assessments for Down syndrome, trisomy 18, and open neural tube defects (spina bifida, anencephaly). Because it does not directly examine the fetus, it cannot positively diagnose or rule out these problems.  Targeted ultrasound uses high frequency sound waves to create an image of the developing fetus.  An ultrasound is often recommended as a routine means of evaluating the pregnancy.  It is also used to screen for fetal anatomy problems (for example, a heart defect) that might be suggestive of a chromosomal or other abnormality.   Should these screening tests indicate an increased concern, then the following additional testing options would be offered:  The chorionic villus sampling procedure is available for first trimester chromosome analysis.  This involves the withdrawal of a small amount of chorionic villi (tissue from the developing placenta).  Risk of pregnancy loss is estimated to be approximately 1 in 200 to 1 in 100 (0.5 to 1%).  There is approximately a 1% (1 in 100) chance that the CVS chromosome results will be unclear.  Chorionic villi cannot be tested for neural tube defects.     Amniocentesis involves the removal of a small amount of amniotic fluid from the sac surrounding the fetus with the use of a thin needle inserted through the maternal abdomen and uterus.  Ultrasound guidance is used throughout the procedure.  Fetal cells from amniotic fluid are directly evaluated and > 99.5% of chromosome problems and >  98% of open neural tube defects can be detected. This procedure is generally performed after the 15th week of pregnancy.  The main risks to this procedure include complications leading to miscarriage in less than 1 in 200 cases (0.5%).  Cystic Fibrosis and Spinal Muscular Atrophy  (SMA) screening were also discussed with the patient. Both conditions are recessive, which means that both parents must be carriers in order to have a child with the disease.  Cystic fibrosis (CF) is one of the most common genetic conditions in persons of Caucasian ancestry.  This condition occurs in approximately 1 in 2,500 Caucasian persons and results in thickened secretions in the lungs, digestive, and reproductive systems.  For a baby to be at risk for having CF, both of the parents must be carriers for this condition.  Approximately 1 in 47 Caucasian persons is a carrier for CF.  Current carrier testing looks for the most common mutations in the gene for CF and can detect approximately 90% of carriers in the Caucasian population.  This means that the carrier screening can greatly reduce, but cannot eliminate, the chance for an individual to have a child with CF.  If an individual is found to be a carrier for CF, then carrier testing would be available for the partner. As part of [place]'s newborn screening profile, all babies born in the state of [place] will have a two-tier screening process.  Specimens are first tested to determine the concentration of immunoreactive trypsinogen (IRT).  The top 5% of specimens with the highest IRT values then undergo DNA testing using a panel of over 40 common CF mutations. SMA is a neurodegenerative disorder that leads to atrophy of skeletal muscle and overall weakness.  This condition is also more prevalent in the Caucasian population, with 1 in 40-1 in 60 persons being a carrier and 1 in 6,000-1 in 10,000 children being affected.  There are multiple forms of the disease, with some causing death in infancy to other forms with survival into adulthood.  The genetics of SMA is complex, but carrier screening can detect up to 95% of carriers in the Caucasian population.  Similar to CF, a negative result can greatly reduce, but cannot eliminate, the chance to have  a child with SMA. Hemoglobinopathy screening was previously performed at [facility] and was normal (AA, MCV 86).  We obtained a detailed family history and pregnancy history.  The family history was reported to be unremarkable for birth defects, intellectual delays, recurrent pregnancy loss or known chromosome abnormalities.  Ms. Lieurance stated that this is the first pregnancy for she and her partner, Akang.  He has a healthy 9 year old daughter from a prior relationship.  She reported no complications in this pregnancy and no exposure to medications, alcohol, tobacco or recreational drugs.  After consideration of the options, Ms. Moger elected to proceed with an ultrasound only for dating and viability.  The gestational age was consistent with 12 weeks.  Fetal anatomy could not be assessed due to early gestational age.  Please refer to the ultrasound report for details of that study.  Ms. Fedrick was encouraged to call with questions or concerns.  We can be contacted at [phone].  Plan of care: Marland Kitchen Declines aneuploidy screening as well as carrier screening. . Return for anatomy ultrasound at [redacted] weeks gestation.   Labs ordered: none - declined  Cherly Anderson, MS, CGC

## 2020-02-25 ENCOUNTER — Encounter: Payer: Self-pay | Admitting: Advanced Practice Midwife

## 2020-02-25 DIAGNOSIS — R825 Elevated urine levels of drugs, medicaments and biological substances: Secondary | ICD-10-CM | POA: Insufficient documentation

## 2020-02-25 LAB — CANNABINOID CONFIRMATION, UR
CANNABINOIDS: POSITIVE — AB
Carboxy THC GC/MS Conf: 65 ng/mL

## 2020-02-25 LAB — 789231 7+OXYCODONE-BUND
Amphetamines, Urine: NEGATIVE ng/mL
BENZODIAZ UR QL: NEGATIVE ng/mL
Barbiturate screen, urine: NEGATIVE ng/mL
Cocaine (Metab.): NEGATIVE ng/mL
OPIATE SCREEN URINE: NEGATIVE ng/mL
Oxycodone/Oxymorphone, Urine: NEGATIVE ng/mL
PCP Quant, Ur: NEGATIVE ng/mL

## 2020-02-26 ENCOUNTER — Encounter: Payer: Self-pay | Admitting: Advanced Practice Midwife

## 2020-02-26 ENCOUNTER — Emergency Department
Admission: EM | Admit: 2020-02-26 | Discharge: 2020-02-26 | Disposition: A | Discharge status: Home / Self Care | Payer: BLUE CROSS/BLUE SHIELD | Attending: Emergency Medicine | Admitting: Emergency Medicine

## 2020-02-26 ENCOUNTER — Telehealth: Payer: Self-pay | Admitting: Student

## 2020-02-26 ENCOUNTER — Encounter: Payer: Self-pay | Admitting: Emergency Medicine

## 2020-02-26 ENCOUNTER — Other Ambulatory Visit: Payer: Self-pay

## 2020-02-26 DIAGNOSIS — Z3A12 12 weeks gestation of pregnancy: Secondary | ICD-10-CM | POA: Insufficient documentation

## 2020-02-26 DIAGNOSIS — O161 Unspecified maternal hypertension, first trimester: Secondary | ICD-10-CM

## 2020-02-26 DIAGNOSIS — Z8616 Personal history of COVID-19: Secondary | ICD-10-CM | POA: Insufficient documentation

## 2020-02-26 DIAGNOSIS — Z7722 Contact with and (suspected) exposure to environmental tobacco smoke (acute) (chronic): Secondary | ICD-10-CM | POA: Insufficient documentation

## 2020-02-26 DIAGNOSIS — O131 Gestational [pregnancy-induced] hypertension without significant proteinuria, first trimester: Secondary | ICD-10-CM | POA: Insufficient documentation

## 2020-02-26 LAB — URINALYSIS, COMPLETE (UACMP) WITH MICROSCOPIC
Bacteria, UA: NONE SEEN
Bilirubin Urine: NEGATIVE
Glucose, UA: NEGATIVE mg/dL
Hgb urine dipstick: NEGATIVE
Ketones, ur: NEGATIVE mg/dL
Leukocytes,Ua: NEGATIVE
Nitrite: NEGATIVE
Protein, ur: NEGATIVE mg/dL
Specific Gravity, Urine: 1.015 (ref 1.005–1.030)
Squamous Epithelial / HPF: NONE SEEN (ref 0–5)
pH: 7 (ref 5.0–8.0)

## 2020-02-26 LAB — CBC WITH DIFFERENTIAL/PLATELET
Abs Immature Granulocytes: 0.03 10*3/uL (ref 0.00–0.07)
Basophils Absolute: 0 10*3/uL (ref 0.0–0.1)
Basophils Relative: 0 %
Eosinophils Absolute: 0.1 10*3/uL (ref 0.0–0.5)
Eosinophils Relative: 1 %
HCT: 32.2 % — ABNORMAL LOW (ref 36.0–46.0)
Hemoglobin: 10.7 g/dL — ABNORMAL LOW (ref 12.0–15.0)
Immature Granulocytes: 0 %
Lymphocytes Relative: 18 %
Lymphs Abs: 2.1 10*3/uL (ref 0.7–4.0)
MCH: 28.4 pg (ref 26.0–34.0)
MCHC: 33.2 g/dL (ref 30.0–36.0)
MCV: 85.4 fL (ref 80.0–100.0)
Monocytes Absolute: 0.7 10*3/uL (ref 0.1–1.0)
Monocytes Relative: 6 %
Neutro Abs: 8.9 10*3/uL — ABNORMAL HIGH (ref 1.7–7.7)
Neutrophils Relative %: 75 %
Platelets: 330 10*3/uL (ref 150–400)
RBC: 3.77 MIL/uL — ABNORMAL LOW (ref 3.87–5.11)
RDW: 13.2 % (ref 11.5–15.5)
WBC: 11.8 10*3/uL — ABNORMAL HIGH (ref 4.0–10.5)
nRBC: 0 % (ref 0.0–0.2)

## 2020-02-26 LAB — COMPREHENSIVE METABOLIC PANEL
ALT: 11 U/L (ref 0–44)
AST: 14 U/L — ABNORMAL LOW (ref 15–41)
Albumin: 3.6 g/dL (ref 3.5–5.0)
Alkaline Phosphatase: 46 U/L (ref 38–126)
Anion gap: 5 (ref 5–15)
BUN: 9 mg/dL (ref 6–20)
CO2: 22 mmol/L (ref 22–32)
Calcium: 9.2 mg/dL (ref 8.9–10.3)
Chloride: 107 mmol/L (ref 98–111)
Creatinine, Ser: 0.54 mg/dL (ref 0.44–1.00)
GFR, Estimated: >60 mL/min (ref >60)
Glucose, Bld: 85 mg/dL (ref 70–99)
Potassium: 3.6 mmol/L (ref 3.5–5.1)
Sodium: 134 mmol/L — ABNORMAL LOW (ref 135–145)
Total Bilirubin: 0.6 mg/dL (ref 0.3–1.2)
Total Protein: 7.3 g/dL (ref 6.5–8.1)

## 2020-02-26 LAB — MAGNESIUM: Magnesium: 1.9 mg/dL (ref 1.7–2.4)

## 2020-02-26 MED ORDER — LABETALOL HCL 100 MG PO TABS: 100.0000 mg | ORAL_TABLET | Freq: Two times a day (BID) | ORAL | 3 refills | Status: DC

## 2020-02-26 NOTE — ED Triage Notes (Signed)
Pt to ED via POV stating that she has had headache x 3 days. Pt states that she is [redacted] weeks pregnant. Pt called her OB and they advised her ot come to the ED because her blood pressure was high. Pt states that she does not have a history of HTN but it has been running higher recently. Pt states that she has tried tylenol for headache but it did not help, pt states that she took benadryl and it did help with the headache but then the headache comes back. Pt is in NAD. [facility].

## 2020-02-26 NOTE — Discharge Instructions (Signed)
Check your blood pressure randomly over the next 2 days.  Check it upon awakening before you get up, throughout the day.  Average your blood pressure readings.  If you are top number is 135 or less consistently or your bottom number is 90 or less consistently you do not need the medicines.  If your blood pressure reading is 135 or higher on the top number or consistently 90 or higher on the bottom number start your prescribed blood pressure medicine.  If you have any readings that are in the 150s or 160s of the top number start your blood pressure medication regardless of what the rest of the readings are.  If you have a blood pressure reading 180 or higher on the top number return to the emergency department.

## 2020-02-26 NOTE — Telephone Encounter (Signed)
TC to patient in response to [facility] CHART message. Patient reports headache for 2 days with no relief from tylenol. Notes some relief with benedryl, but headache returned. Had 1 episode of vomiting on day 1 of headache. Denies vision change. Instructed to take blood pressure at home and took with a wrist cuff. BP reported 148/99. Pt instructed to report to ED immediately for evaluation per [place], CNM. Pt reports that she will report to ED once off phone with this RN. Sharlyne Pacas, RN
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
## 2020-02-29 ENCOUNTER — Encounter: Payer: Self-pay | Admitting: Student

## 2020-02-29 ENCOUNTER — Telehealth: Payer: Self-pay

## 2020-02-29 ENCOUNTER — Telehealth: Payer: Self-pay | Admitting: Student

## 2020-02-29 NOTE — Telephone Encounter (Signed)
LM with pt to follow/up with c/o headache and high blood pressure. Pt went to ED for symptoms via [facility] recommendations and was instructed to start BP medicine if her BP was outside of the criteria given. Will consult with provider re patient's next appointment. Sharlyne Pacas, RN

## 2020-02-29 NOTE — Telephone Encounter (Signed)
My Chart message sent to pt to f/u with HD to schedule and appointment as soon as possible for evaluation. Sharlyne Pacas, RN

## 2020-02-29 NOTE — Telephone Encounter (Signed)
TC to patient to schedule BP check. Patient states she was given medication and  told at the ED on 02/26/20 to take the BP medication only if "the top number is over 135". Patient states she has been checking her blood pressures and her BP "top numbers" have not been over 135 since her trip to the hospital. Patient started a new job recently and thinks that may be what caused her stress and elevated BP. Patient scheduled to come in to clinic on 03/02/2020 for BP check.Burt Knack, RN

## 2020-03-01 ENCOUNTER — Encounter: Payer: Self-pay | Admitting: Physician Assistant

## 2020-03-02 ENCOUNTER — Ambulatory Visit: Payer: BLUE CROSS/BLUE SHIELD | Admitting: Advanced Practice Midwife

## 2020-03-02 ENCOUNTER — Other Ambulatory Visit: Payer: Self-pay

## 2020-03-02 VITALS — BP 137/91 | HR 106 | Temp 98.5°F | Wt 171.4 lb

## 2020-03-02 DIAGNOSIS — R825 Elevated urine levels of drugs, medicaments and biological substances: Secondary | ICD-10-CM | POA: Diagnosis not present

## 2020-03-02 DIAGNOSIS — O10919 Unspecified pre-existing hypertension complicating pregnancy, unspecified trimester: Secondary | ICD-10-CM | POA: Diagnosis not present

## 2020-03-02 DIAGNOSIS — O161 Unspecified maternal hypertension, first trimester: Secondary | ICD-10-CM | POA: Diagnosis not present

## 2020-03-02 DIAGNOSIS — O119 Pre-existing hypertension with pre-eclampsia, unspecified trimester: Secondary | ICD-10-CM | POA: Insufficient documentation

## 2020-03-02 DIAGNOSIS — O0991 Supervision of high risk pregnancy, unspecified, first trimester: Secondary | ICD-10-CM

## 2020-03-02 LAB — URINALYSIS
Bilirubin, UA: NEGATIVE
Glucose, UA: NEGATIVE
Ketones, UA: NEGATIVE
Nitrite, UA: NEGATIVE
Protein,UA: NEGATIVE
RBC, UA: NEGATIVE
Specific Gravity, UA: 1.01 (ref 1.005–1.030)
Urobilinogen, Ur: 0.2 mg/dL (ref 0.2–1.0)
pH, UA: 6.5 (ref 5.0–7.5)

## 2020-03-02 NOTE — Progress Notes (Signed)
   PRENATAL VISIT NOTE  Subjective:  Veronica Castillo is a 30 y.o. G1P0000 at [redacted]w[redacted]d being seen today for ongoing prenatal care.  She is currently monitored for the following issues for this high-risk pregnancy and has Corpus luteum cyst hemorrhage; Hemorrhage of corpus luteum cyst; Elevated blood pressure affecting pregnancy in first trimester, antepartum; Supervision of high risk pregnancy in first trimester; COVID-19 11/2018; Positive urine drug screen 02/15/20 MJ; and HTN in pregnancy, chronic on their problem list.  Patient reports no complaints.  Contractions: Not present. Vag. Bleeding: None.  Movement: Absent. Denies leaking of fluid/ROM.   The following portions of the patient's history were reviewed and updated as appropriate: allergies, current medications, past family history, past medical history, past social history, past surgical history and problem list. Problem list updated.  Objective:   Vitals:   03/02/20 1557 03/02/20 1558  BP: (!) 152/88 (!) 137/91  Pulse: (!) 106   Temp: 98.5 F (36.9 C)   Weight: 171 lb 6.4 oz (77.7 kg)     Fetal Status: Fetal Heart Rate (bpm): 160 Fundal Height: 13 cm Movement: Absent     General:  Alert, oriented and cooperative. Patient is in no acute distress.  Skin: Skin is warm and dry. No rash noted.   Cardiovascular: Normal heart rate noted  Respiratory: Normal respiratory effort, no problems with respiration noted  Abdomen: Soft, gravid, appropriate for gestational age.  Pain/Pressure: Absent     Pelvic: Cervical exam deferred        Extremities: Normal range of motion.  Edema: None  Mental Status: Normal mood and affect. Normal behavior. Normal judgment and thought content.   Assessment and Plan:  Pregnancy: G1P0000 at [redacted]w[redacted]d  1. Supervision of high risk pregnancy in first trimester Reviewed ER visit on 02/15/20 with h/a x 2 days and Bp=147/101.  ER MD rx'd Labetalol 100mg  BID. Pt states she didn't pick up rx and hasn't begun taking.   152/88, 137/91.  Denies h/a, scotoma, neg proteinuria today.  Consulted with Dr. who agrees pt needs to transfer prenatal care due to chronic HTN.  Pt unsure if her BC/BS provides maternity coverage--she will call Alvester Morin tomorrow with answer so we know where to advise transfer of care.  Pt [facility] to deliver [facility].  Pt states she has presumptive Medicaid and was denied Medicaid.  If BC/BS doesn't cover maternity care then option may be Center for [facility] in [place]. Reviewed 02/23/20 u/s at 11 6/7 with genetic counseling, fundal placenta. Pt declined FIRST screen Anatomy u/s is 04/12/20. - Urinalysis (Urine Dip) 06/12/20 Drug Screen  2. Elevated blood pressure affecting pregnancy in first trimester, antepartum 152/88, 137/91 negative protein  3. Positive urine drug screen 02/15/20 MJ Pt states last use 12/2019; agrees to UDS today  4. HTN in pregnancy, chronic See above note   Preterm labor symptoms and general obstetric precautions including but not limited to vaginal bleeding, contractions, leaking of fluid and fetal movement were reviewed in detail with the patient. Please refer to After Visit Summary for other counseling recommendations.  No follow-ups on file.  Future Appointments  Date Time Provider Department Center  03/14/2020  4:00 PM [facility] PROVIDER [facility] None  04/12/2020  8:00 AM [facility]    06/12/2020, CNM

## 2020-03-02 NOTE — Progress Notes (Signed)
Reports 0530 hone BP with wrist cuff = 122/88 and repeat at 0630 120/86. Jossie Ng, RN

## 2020-03-03 ENCOUNTER — Telehealth: Payer: Self-pay

## 2020-03-03 LAB — 789231 7+OXYCODONE-BUND
Amphetamines, Urine: NEGATIVE ng/mL
BENZODIAZ UR QL: NEGATIVE ng/mL
Barbiturate screen, urine: NEGATIVE ng/mL
Cannabinoid Quant, Ur: NEGATIVE ng/mL
Cocaine (Metab.): NEGATIVE ng/mL
OPIATE SCREEN URINE: NEGATIVE ng/mL
Oxycodone/Oxymorphone, Urine: NEGATIVE ng/mL
PCP Quant, Ur: NEGATIVE ng/mL

## 2020-03-03 NOTE — Telephone Encounter (Signed)
Client needs transfer of prenatal care (see provider note from 03/02/20) and RN to call client  today and  verify insurance plan covers prenathis amal care. Per client, she spoke with insurance company and she has prenatal coverage. Requesting transfer of care to [facility].Hazle Coca CNM notified. Jossie Ng, RN

## 2020-03-04 NOTE — Telephone Encounter (Signed)
[facility] transfer of care referral with demographics and all records faxed with confirmation received. Jossie Ng, RN

## 2020-03-08 ENCOUNTER — Telehealth: Payer: Self-pay

## 2020-03-08 ENCOUNTER — Encounter: Payer: Self-pay | Admitting: Advanced Practice Midwife

## 2020-03-08 NOTE — Telephone Encounter (Signed)
Call to [facility] to ascertain if transfer of care appt scheduled (referral with records faxed 03/04/20 with confirmation received). Per receptionist, unable to locate records. Referral and all records re-faxed with confirmation received. Jossie Ng, RN

## 2020-03-10 ENCOUNTER — Telehealth: Payer: Self-pay

## 2020-03-10 NOTE — Telephone Encounter (Signed)
Call to [facility] to ascertain if transfer of care appt has been scheduled. Appt scheduled for 03/24/2020 at 1015. Call to client and left message with appt and counseled to arrive 30 minutes early for registration. Requested client return call to verify appt information received and number to call provided. Jossie Ng, RN

## 2020-03-11 NOTE — Telephone Encounter (Signed)
Phone call to pt. Left detailed message about [facility] appt scheduled for 03/24/20. [facility] wants to verify that she received the message and is aware of appt.

## 2020-03-11 NOTE — Telephone Encounter (Signed)
Client has [facility] transfer of care appt on 03/24/2020 at 1015 (arrive 30 min early for registration). Call to client to verify aware of appt and requested call back to RN. Number to call provided. Jossie Ng, RN

## 2020-03-14 ENCOUNTER — Telehealth: Payer: Self-pay | Admitting: Family Medicine

## 2020-03-14 ENCOUNTER — Ambulatory Visit: Payer: BLUE CROSS/BLUE SHIELD

## 2020-03-14 ENCOUNTER — Encounter: Payer: Self-pay | Admitting: Advanced Practice Midwife

## 2020-03-14 NOTE — Telephone Encounter (Signed)
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 Patient states she cannot go to appointment made for her with [facility] due to job. She needs an appointment any day after 3:00pm.

## 2020-03-14 NOTE — Telephone Encounter (Signed)
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 Patient states she cannot go to appointment made for her with [facility] due to job. She needs an appointment any day after 3:00pm. 

## 2020-03-14 NOTE — Telephone Encounter (Signed)
Phone call to pt. Left detailed message about [facility] appt scheduled for 03/24/20. [facility] wants to verify that she received the message and is aware of appt. 

## 2020-03-14 NOTE — Telephone Encounter (Signed)
Received phone call back from pt, left message that needs appt after 3:00 pm.  Phone call back to pt. Pt states she will call [facility] directly to reschedule her appt with them. [facility] number provided.

## 2020-03-14 NOTE — Telephone Encounter (Signed)
Phone call to pt. Pt states she will call [facility] directly to reschedule her appt with them. [facility] number provided.

## 2020-03-29 ENCOUNTER — Other Ambulatory Visit: Payer: Self-pay | Admitting: Advanced Practice Midwife

## 2020-03-29 DIAGNOSIS — O10912 Unspecified pre-existing hypertension complicating pregnancy, second trimester: Secondary | ICD-10-CM

## 2020-03-29 DIAGNOSIS — O99212 Obesity complicating pregnancy, second trimester: Secondary | ICD-10-CM

## 2020-04-12 ENCOUNTER — Other Ambulatory Visit: Payer: Self-pay

## 2020-04-12 ENCOUNTER — Encounter: Payer: Self-pay | Admitting: Advanced Practice Midwife

## 2020-04-12 ENCOUNTER — Encounter: Payer: Self-pay | Admitting: Obstetrics and Gynecology

## 2020-04-12 ENCOUNTER — Ambulatory Visit: Payer: BLUE CROSS/BLUE SHIELD | Attending: Maternal & Fetal Medicine

## 2020-04-12 ENCOUNTER — Ambulatory Visit (INDEPENDENT_AMBULATORY_CARE_PROVIDER_SITE_OTHER): Payer: BLUE CROSS/BLUE SHIELD | Admitting: Obstetrics and Gynecology

## 2020-04-12 VITALS — BP 136/82 | HR 83 | Wt 174.4 lb

## 2020-04-12 DIAGNOSIS — O10912 Unspecified pre-existing hypertension complicating pregnancy, second trimester: Secondary | ICD-10-CM

## 2020-04-12 DIAGNOSIS — O99212 Obesity complicating pregnancy, second trimester: Secondary | ICD-10-CM | POA: Diagnosis present

## 2020-04-12 DIAGNOSIS — Z3A19 19 weeks gestation of pregnancy: Secondary | ICD-10-CM | POA: Insufficient documentation

## 2020-04-12 DIAGNOSIS — O0991 Supervision of high risk pregnancy, unspecified, first trimester: Secondary | ICD-10-CM

## 2020-04-12 DIAGNOSIS — O321XX0 Maternal care for breech presentation, not applicable or unspecified: Secondary | ICD-10-CM | POA: Diagnosis not present

## 2020-04-12 DIAGNOSIS — R825 Elevated urine levels of drugs, medicaments and biological substances: Secondary | ICD-10-CM

## 2020-04-12 DIAGNOSIS — O10919 Unspecified pre-existing hypertension complicating pregnancy, unspecified trimester: Secondary | ICD-10-CM

## 2020-04-12 NOTE — Progress Notes (Signed)
   PRENATAL VISIT NOTE  Transfer of care visit from [facility] for Chronic HTN  Subjective:  Veronica Castillo is a 30 y.o. G1P0000 at [redacted]w[redacted]d being seen today for ongoing prenatal care.  She is currently monitored for the following issues for this high-risk pregnancy and has Elevated blood pressure affecting pregnancy in first trimester, antepartum; Supervision of high risk pregnancy in first trimester; Positive urine drug screen 02/15/20 MJ; and HTN in pregnancy, chronic on their problem list.  Patient reports no complaints.  Contractions: Not present. Vag. Bleeding: None.  Movement: Present. Denies leaking of fluid.   The following portions of the patient's history were reviewed and updated as appropriate: allergies, current medications, past family history, past medical history, past social history, past surgical history and problem list.   Objective:   Vitals:   04/12/20 1321  BP: 136/82  Pulse: 83  Weight: 174 lb 6.4 oz (79.1 kg)    Fetal Status: Fetal Heart Rate (bpm): 154   Movement: Present     General:  Alert, oriented and cooperative. Patient is in no acute distress.  Skin: Skin is warm and dry. No rash noted.   Cardiovascular: Normal heart rate noted  Respiratory: Normal respiratory effort, no problems with respiration noted  Abdomen: Soft, gravid, appropriate for gestational age.  Pain/Pressure: Absent     Pelvic: Cervical exam deferred        Extremities: Normal range of motion.  Edema: None  Mental Status: Normal mood and affect. Normal behavior. Normal judgment and thought content.   Assessment and Plan:  Pregnancy: G1P0000 at [redacted]w[redacted]d 1. Supervision of high risk pregnancy in first trimester Routine care. Pt amenable to AFP screening given the cHTN. Pt declines genetics. Needs baseline PC ratio. Anatomy u/s done today and has completion scan in a month  Pt confirms on low dose ASA. I d/w her re: plan of care for cHTN and I also d/w her re: our group and practice setting - AFP,  Serum, Open Spina Bifida - Protein / creatinine ratio, urine  2. Positive urine drug screen 02/15/20 MJ  3. HTN in pregnancy, chronic - AFP, Serum, Open Spina Bifida - Protein / creatinine ratio, urine  Preterm labor symptoms and general obstetric precautions including but not limited to vaginal bleeding, contractions, leaking of fluid and fetal movement were reviewed in detail with the patient. Please refer to After Visit Summary for other counseling recommendations.   Return in about 3 weeks (around 05/03/2020).  Future Appointments  Date Time Provider Department Center  05/03/2020  3:15 PM Constant, Gigi Gin, MD [facility]  05/10/2020  2:00 PM [facility]    Harriman Bing, MD

## 2020-04-13 ENCOUNTER — Encounter (HOSPITAL_COMMUNITY): Payer: Self-pay

## 2020-04-13 ENCOUNTER — Ambulatory Visit (HOSPITAL_COMMUNITY)
Admission: EM | Admit: 2020-04-13 | Discharge: 2020-04-13 | Disposition: A | Discharge status: Home / Self Care | Payer: BC Managed Care – PPO | Attending: Physician Assistant | Admitting: Physician Assistant

## 2020-04-13 ENCOUNTER — Other Ambulatory Visit: Payer: Self-pay

## 2020-04-13 DIAGNOSIS — O10919 Unspecified pre-existing hypertension complicating pregnancy, unspecified trimester: Secondary | ICD-10-CM | POA: Diagnosis present

## 2020-04-13 DIAGNOSIS — K047 Periapical abscess without sinus: Secondary | ICD-10-CM | POA: Insufficient documentation

## 2020-04-13 DIAGNOSIS — Z3A19 19 weeks gestation of pregnancy: Secondary | ICD-10-CM | POA: Diagnosis not present

## 2020-04-13 DIAGNOSIS — O10912 Unspecified pre-existing hypertension complicating pregnancy, second trimester: Secondary | ICD-10-CM | POA: Diagnosis not present

## 2020-04-13 LAB — POCT URINALYSIS DIPSTICK, ED / UC
Bilirubin Urine: NEGATIVE
Glucose, UA: NEGATIVE mg/dL
Hgb urine dipstick: NEGATIVE
Ketones, ur: NEGATIVE mg/dL
Nitrite: NEGATIVE
Protein, ur: NEGATIVE mg/dL
Specific Gravity, Urine: 1.02 (ref 1.005–1.030)
Urobilinogen, UA: 0.2 mg/dL (ref 0.0–1.0)
pH: 6.5 (ref 5.0–8.0)

## 2020-04-13 LAB — AFP, SERUM, OPEN SPINA BIFIDA
AFP MoM: 1.22
AFP Value: 39.6 ng/mL
Gest. Age on Collection Date: 15.5 weeks
Maternal Age At EDD: 27.7 yr
OSBR Risk 1 IN: 10000
Test Results:: NEGATIVE
Weight: 174 [lb_av]

## 2020-04-13 LAB — PROTEIN / CREATININE RATIO, URINE
Creatinine, Urine: 165.6 mg/dL
Protein, Ur: 12.2 mg/dL
Protein/Creat Ratio: 74 mg/g creat (ref 0–200)

## 2020-04-13 MED ORDER — AMOXICILLIN-POT CLAVULANATE 875-125 MG PO TABS: 1.0000 | ORAL_TABLET | Freq: Two times a day (BID) | ORAL | 0 refills | Status: DC
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
## 2020-04-13 NOTE — ED Triage Notes (Signed)
Pt states is having pain in wisdom teeth on left side. States because she is pregnant she wanted to come in as she knows she will be limited in what she can take. Some redness and swelling noted to top and bottom gums at the back.

## 2020-04-13 NOTE — Discharge Instructions (Signed)
Take antibiotic twice daily as discussed.  Use Tylenol for pain.  Follow-up with dentist as we discussed.  Keep track of your blood pressure and contact your OB with elevated readings.  If you develop any mouth swelling, muffled voice, fever, trouble swallowing you need to be seen immediately.

## 2020-04-14 LAB — URINE CULTURE: Culture: 20000 — AB

## 2020-05-03 ENCOUNTER — Encounter: Payer: BLUE CROSS/BLUE SHIELD | Admitting: Obstetrics and Gynecology

## 2020-05-03 ENCOUNTER — Telehealth: Payer: BLUE CROSS/BLUE SHIELD | Admitting: Obstetrics and Gynecology

## 2020-05-04 ENCOUNTER — Other Ambulatory Visit: Payer: Self-pay

## 2020-05-04 ENCOUNTER — Ambulatory Visit (INDEPENDENT_AMBULATORY_CARE_PROVIDER_SITE_OTHER): Payer: BLUE CROSS/BLUE SHIELD | Admitting: Family Medicine

## 2020-05-04 VITALS — BP 132/85 | HR 99 | Wt 183.0 lb

## 2020-05-04 DIAGNOSIS — O10919 Unspecified pre-existing hypertension complicating pregnancy, unspecified trimester: Secondary | ICD-10-CM

## 2020-05-04 DIAGNOSIS — O0991 Supervision of high risk pregnancy, unspecified, first trimester: Secondary | ICD-10-CM

## 2020-05-04 DIAGNOSIS — R825 Elevated urine levels of drugs, medicaments and biological substances: Secondary | ICD-10-CM

## 2020-05-04 NOTE — Progress Notes (Signed)
   PRENATAL VISIT NOTE  Subjective:  Veronica Castillo is a 30 y.o. G1P0000 at [redacted]w[redacted]d being seen today for ongoing prenatal care.  Veronica Castillo is currently monitored for the following issues for this high-risk pregnancy and has Supervision of high risk pregnancy in first trimester; Positive urine drug screen 02/15/20 MJ; and HTN in pregnancy, chronic on their problem list.  Patient reports no complaints.  Contractions: Not present. Vag. Bleeding: None.  Movement: Present. Denies leaking of fluid.   The following portions of the patient's history were reviewed and updated as appropriate: allergies, current medications, past family history, past medical history, past social history, past surgical history and problem list.   Objective:   Vitals:   05/04/20 1550  BP: 132/85  Pulse: 99  Weight: 183 lb (83 kg)    Fetal Status: Fetal Heart Rate (bpm): 158 Fundal Height: 23 cm Movement: Present     General:  Alert, oriented and cooperative. Patient is in no acute distress.  Skin: Skin is warm and dry. No rash noted.   Cardiovascular: Normal heart rate noted  Respiratory: Normal respiratory effort, no problems with respiration noted  Abdomen: Soft, gravid, appropriate for gestational age.  Pain/Pressure: Absent     Pelvic: Cervical exam deferred        Extremities: Normal range of motion.  Edema: None  Mental Status: Normal mood and affect. Normal behavior. Normal judgment and thought content.   Assessment and Plan:  Pregnancy: G1P0000 at [redacted]w[redacted]d 1. HTN in pregnancy, chronic BP wnl today Made good lifestyle adjustment  2. Supervision of high risk pregnancy in first trimester Up to date Discussed  Childbirth ed class Reviewed plans for breastfeeding Discussed increased protein in diet and water intake.    Preterm labor symptoms and general obstetric precautions including but not limited to vaginal bleeding, contractions, leaking of fluid and fetal movement were reviewed in detail with the  patient. Please refer to After Visit Summary for other counseling recommendations.   Return in about 4 weeks (around 06/01/2020) for Routine prenatal care, MD or APP.  Future Appointments  Date Time Provider Department Center  05/10/2020  2:00 PM [facility]  06/02/2020  3:30 PM Federico Flake, MD [facility]    Federico Flake, MD

## 2020-05-09 ENCOUNTER — Other Ambulatory Visit: Payer: Self-pay | Admitting: Advanced Practice Midwife

## 2020-05-09 DIAGNOSIS — O10912 Unspecified pre-existing hypertension complicating pregnancy, second trimester: Secondary | ICD-10-CM

## 2020-05-10 ENCOUNTER — Other Ambulatory Visit: Payer: Self-pay

## 2020-05-10 ENCOUNTER — Ambulatory Visit: Payer: BLUE CROSS/BLUE SHIELD | Attending: Maternal & Fetal Medicine

## 2020-05-10 DIAGNOSIS — O10912 Unspecified pre-existing hypertension complicating pregnancy, second trimester: Secondary | ICD-10-CM

## 2020-05-10 DIAGNOSIS — Z3A23 23 weeks gestation of pregnancy: Secondary | ICD-10-CM

## 2020-06-02 ENCOUNTER — Ambulatory Visit (INDEPENDENT_AMBULATORY_CARE_PROVIDER_SITE_OTHER): Payer: BLUE CROSS/BLUE SHIELD | Admitting: Family Medicine

## 2020-06-02 ENCOUNTER — Other Ambulatory Visit: Payer: Self-pay

## 2020-06-02 VITALS — BP 143/88 | HR 77 | Wt 188.0 lb

## 2020-06-02 DIAGNOSIS — R825 Elevated urine levels of drugs, medicaments and biological substances: Secondary | ICD-10-CM

## 2020-06-02 DIAGNOSIS — O10919 Unspecified pre-existing hypertension complicating pregnancy, unspecified trimester: Secondary | ICD-10-CM

## 2020-06-02 DIAGNOSIS — O0991 Supervision of high risk pregnancy, unspecified, first trimester: Secondary | ICD-10-CM

## 2020-06-02 NOTE — Progress Notes (Signed)
   PRENATAL VISIT NOTE  Subjective:  Veronica Castillo is a 30 y.o. G1P0000 at [redacted]w[redacted]d being seen today for ongoing prenatal care.  She is currently monitored for the following issues for this high-risk pregnancy and has Supervision of high risk pregnancy in first trimester; Positive urine drug screen 02/15/20 MJ; and Chronic hypertension affecting pregnancy on their problem list.  Patient reports no complaints.  Contractions: Not present. Vag. Bleeding: None.  Movement: Present. Denies leaking of fluid.   The following portions of the patient's history were reviewed and updated as appropriate: allergies, current medications, past family history, past medical history, past social history, past surgical history and problem list.   Objective:   Vitals:   06/02/20 1537  BP: (!) 143/88  Pulse: 77  Weight: 188 lb (85.3 kg)    Fetal Status: Fetal Heart Rate (bpm): 150   Movement: Present     General:  Alert, oriented and cooperative. Patient is in no acute distress.  Skin: Skin is warm and dry. No rash noted.   Cardiovascular: Normal heart rate noted  Respiratory: Normal respiratory effort, no problems with respiration noted  Abdomen: Soft, gravid, appropriate for gestational age.  Pain/Pressure: Absent     Pelvic: Cervical exam deferred        Extremities: Normal range of motion.  Edema: Trace  Mental Status: Normal mood and affect. Normal behavior. Normal judgment and thought content.   Assessment and Plan:  Pregnancy: G1P0000 at [redacted]w[redacted]d  1. Supervision of high risk pregnancy in first trimester Up to date  2. Positive urine drug screen 02/15/20 MJ  3. Chronic hypertension affecting pregnancy BP elevated today, just came from work. Not at the level that treatment needed Encouraged water intake  Preterm labor symptoms and general obstetric precautions including but not limited to vaginal bleeding, contractions, leaking of fluid and fetal movement were reviewed in detail with the  patient. Please refer to After Visit Summary for other counseling recommendations.   Return in about 2 weeks (around 06/16/2020) for Routine prenatal care, 28 wk labs.  Future Appointments  Date Time Provider Department Center  06/14/2020  2:00 PM [facility]    Federico Flake, MD

## 2020-06-06 ENCOUNTER — Other Ambulatory Visit: Payer: Self-pay | Admitting: Advanced Practice Midwife

## 2020-06-06 DIAGNOSIS — O10919 Unspecified pre-existing hypertension complicating pregnancy, unspecified trimester: Secondary | ICD-10-CM

## 2020-06-14 ENCOUNTER — Other Ambulatory Visit: Payer: Self-pay

## 2020-06-14 ENCOUNTER — Ambulatory Visit: Payer: BLUE CROSS/BLUE SHIELD | Attending: Obstetrics

## 2020-06-14 ENCOUNTER — Encounter: Payer: Self-pay | Admitting: Advanced Practice Midwife

## 2020-06-14 DIAGNOSIS — O10913 Unspecified pre-existing hypertension complicating pregnancy, third trimester: Secondary | ICD-10-CM | POA: Insufficient documentation

## 2020-06-14 DIAGNOSIS — Z3A28 28 weeks gestation of pregnancy: Secondary | ICD-10-CM | POA: Diagnosis not present

## 2020-06-14 DIAGNOSIS — O10919 Unspecified pre-existing hypertension complicating pregnancy, unspecified trimester: Secondary | ICD-10-CM | POA: Diagnosis present

## 2020-06-14 DIAGNOSIS — O10013 Pre-existing essential hypertension complicating pregnancy, third trimester: Secondary | ICD-10-CM | POA: Diagnosis not present

## 2020-06-17 ENCOUNTER — Ambulatory Visit (INDEPENDENT_AMBULATORY_CARE_PROVIDER_SITE_OTHER): Payer: BLUE CROSS/BLUE SHIELD | Admitting: Nurse Practitioner

## 2020-06-17 ENCOUNTER — Encounter: Payer: Self-pay | Admitting: Nurse Practitioner

## 2020-06-17 ENCOUNTER — Other Ambulatory Visit: Payer: Self-pay

## 2020-06-17 ENCOUNTER — Other Ambulatory Visit: Payer: BLUE CROSS/BLUE SHIELD

## 2020-06-17 VITALS — BP 146/88 | HR 69 | Wt 190.0 lb

## 2020-06-17 DIAGNOSIS — Z23 Encounter for immunization: Secondary | ICD-10-CM

## 2020-06-17 DIAGNOSIS — Z3A28 28 weeks gestation of pregnancy: Secondary | ICD-10-CM

## 2020-06-17 DIAGNOSIS — O0993 Supervision of high risk pregnancy, unspecified, third trimester: Secondary | ICD-10-CM

## 2020-06-17 NOTE — Patient Instructions (Signed)
Return to clinic in about 2 weeks. If you have problems before then, call the office or go to the Maternity assessment unit at [facility].

## 2020-06-17 NOTE — Progress Notes (Signed)
   PRENATAL VISIT NOTE  Subjective:  Veronica Castillo is a 30 y.o. G1P0000 at [redacted]w[redacted]d being seen today for ongoing prenatal care.  She is currently monitored for the following issues for this high-risk pregnancy and has Supervision of high risk pregnancy in first trimester; Positive urine drug screen 02/15/20 MJ; and Chronic hypertension affecting pregnancy on their problem list.  Patient reports no complaints.  Contractions: Not present. Vag. Bleeding: None.  Movement: Present. Denies leaking of fluid.   The following portions of the patient's history were reviewed and updated as appropriate: allergies, current medications, past family history, past medical history, past social history, past surgical history and problem list.   Objective:   Vitals:   06/17/20 0944  BP: (!) 146/88  Pulse: 69  Weight: 190 lb (86.2 kg)    Fetal Status: Fetal Heart Rate (bpm): 147   Movement: Present     General:  Alert, oriented and cooperative. Patient is in no acute distress.  Skin: Skin is warm and dry. No rash noted.   Cardiovascular: Normal heart rate noted, regular rate and rhythm  Respiratory: Normal respiratory effort, no problems with respiration noted, lungs clear  Abdomen: Soft, gravid, appropriate for gestational age.  Pain/Pressure: Absent     Pelvic: Cervical exam deferred        Extremities: Normal range of motion.  Edema: Trace  Mental Status: Normal mood and affect. Normal behavior. Normal judgment and thought content.   Assessment and Plan:  Pregnancy: G1P0000 at [redacted]w[redacted]d 1. Supervision of high risk pregnancy in third trimester - Glucose Tolerance, 2 Hours w/1 Hour - CBC - RPR - HIV Antibody (routine testing w rflx)  Preterm labor symptoms and general obstetric precautions including but not limited to vaginal bleeding, contractions, leaking of fluid and fetal movement were reviewed in detail with the patient. Please refer to After Visit Summary for other counseling recommendations.    Return in about 2 weeks (around 07/01/2020). Recheck BP prior to D/C.  Future Appointments  Date Time Provider Department Center  07/19/2020  4:00 PM [facility]    Kerrie Buffalo, NP

## 2020-06-18 LAB — GLUCOSE TOLERANCE, 2 HOURS W/ 1HR
Glucose, 1 hour: 125 mg/dL (ref 65–179)
Glucose, 2 hour: 108 mg/dL (ref 65–152)
Glucose, Fasting: 75 mg/dL (ref 65–91)

## 2020-06-18 LAB — CBC
Hematocrit: 32.3 % — ABNORMAL LOW (ref 34.0–46.6)
Hemoglobin: 10.4 g/dL — ABNORMAL LOW (ref 11.1–15.9)
MCH: 27.7 pg (ref 26.6–33.0)
MCHC: 32.2 g/dL (ref 31.5–35.7)
MCV: 86 fL (ref 79–97)
Platelets: 302 10*3/uL (ref 150–450)
RBC: 3.76 x10E6/uL — ABNORMAL LOW (ref 3.77–5.28)
RDW: 12.2 % (ref 11.7–15.4)
WBC: 12.1 10*3/uL — ABNORMAL HIGH (ref 3.4–10.8)

## 2020-06-18 LAB — HIV ANTIBODY (ROUTINE TESTING W REFLEX): HIV Screen 4th Generation wRfx: NONREACTIVE

## 2020-06-18 LAB — RPR: RPR Ser Ql: NONREACTIVE

## 2020-06-20 ENCOUNTER — Encounter: Payer: Self-pay | Admitting: Obstetrics & Gynecology

## 2020-06-30 ENCOUNTER — Other Ambulatory Visit: Payer: Self-pay

## 2020-06-30 ENCOUNTER — Ambulatory Visit (INDEPENDENT_AMBULATORY_CARE_PROVIDER_SITE_OTHER): Payer: BC Managed Care – PPO | Admitting: Obstetrics and Gynecology

## 2020-06-30 VITALS — BP 131/88 | HR 83 | Wt 191.8 lb

## 2020-06-30 DIAGNOSIS — O10919 Unspecified pre-existing hypertension complicating pregnancy, unspecified trimester: Secondary | ICD-10-CM

## 2020-06-30 DIAGNOSIS — O0993 Supervision of high risk pregnancy, unspecified, third trimester: Secondary | ICD-10-CM

## 2020-06-30 NOTE — Progress Notes (Signed)
   PRENATAL VISIT NOTE  Subjective:  Veronica Castillo is a 30 y.o. G1P0000 at [redacted]w[redacted]d being seen today for ongoing prenatal care.  She is currently monitored for the following issues for this high-risk pregnancy and has Supervision of high risk pregnancy in third trimester; Positive urine drug screen 02/15/20 MJ; and Chronic hypertension affecting pregnancy on their problem list.  Patient reports no complaints.  Contractions: Not present. Vag. Bleeding: None.  Movement: Present. Denies leaking of fluid.   The following portions of the patient's history were reviewed and updated as appropriate: allergies, current medications, past family history, past medical history, past social history, past surgical history and problem list.   Objective:   Vitals:   06/30/20 1406  BP: 131/88  Pulse: 83  Weight: 191 lb 12.8 oz (87 kg)    Fetal Status: Fetal Heart Rate (bpm): 155   Movement: Present     General:  Alert, oriented and cooperative. Patient is in no acute distress.  Skin: Skin is warm and dry. No rash noted.   Cardiovascular: Normal heart rate noted  Respiratory: Normal respiratory effort, no problems with respiration noted  Abdomen: Soft, gravid, appropriate for gestational age.  Pain/Pressure: Absent     Pelvic: Cervical exam deferred        Extremities: Normal range of motion.     Mental Status: Normal mood and affect. Normal behavior. Normal judgment and thought content.   Assessment and Plan:  Pregnancy: G1P0000 at [redacted]w[redacted]d 1. Chronic hypertension affecting pregnancy Doing well on no meds. I told her if has another mild range BP, i'd recommend starting PO meds  6/14: 51%, 1261gm, ac 58%  Needs rpt growth in 2-3wks - [place] MFM OB FOLLOW UP; Future  2. Supervision of high risk pregnancy in third trimester - [place] MFM OB FOLLOW UP; Future  Preterm labor symptoms and general obstetric precautions including but not limited to vaginal bleeding, contractions, leaking of fluid and fetal  movement were reviewed in detail with the patient. Please refer to After Visit Summary for other counseling recommendations.   Return in about 2 weeks (around 07/14/2020) for in person, md visit, high risk ob.  Future Appointments  Date Time Provider Department Center  07/19/2020  4:00 PM [facility]    Lauderdale Bing, MD

## 2020-07-14 ENCOUNTER — Encounter: Payer: BC Managed Care – PPO | Admitting: Obstetrics & Gynecology

## 2020-07-14 ENCOUNTER — Ambulatory Visit (INDEPENDENT_AMBULATORY_CARE_PROVIDER_SITE_OTHER): Payer: BC Managed Care – PPO | Admitting: Obstetrics & Gynecology

## 2020-07-14 ENCOUNTER — Encounter: Payer: Self-pay | Admitting: Obstetrics & Gynecology

## 2020-07-14 ENCOUNTER — Other Ambulatory Visit: Payer: Self-pay

## 2020-07-14 VITALS — BP 158/103 | HR 80 | Wt 197.0 lb

## 2020-07-14 DIAGNOSIS — O10919 Unspecified pre-existing hypertension complicating pregnancy, unspecified trimester: Secondary | ICD-10-CM

## 2020-07-14 DIAGNOSIS — O0993 Supervision of high risk pregnancy, unspecified, third trimester: Secondary | ICD-10-CM

## 2020-07-14 DIAGNOSIS — Z3A32 32 weeks gestation of pregnancy: Secondary | ICD-10-CM

## 2020-07-14 MED ORDER — NIFEDIPINE ER OSMOTIC RELEASE 30 MG PO TB24: 30.0000 mg | ORAL_TABLET | Freq: Every day | ORAL | 2 refills | Status: DC

## 2020-07-14 NOTE — Patient Instructions (Signed)
Return to office for any scheduled appointments. Call the office or go to the MAU at [facility] if:  You begin to have strong, frequent contractions  Your water breaks.  Sometimes it is a big gush of fluid, sometimes it is just a trickle that keeps getting your panties wet or running down your legs  You have vaginal bleeding.  It is normal to have a small amount of spotting if your cervix was checked.   You do not feel your baby moving like normal.  If you do not, get something to eat and drink and lay down and focus on feeling your baby move.   If your baby is still not moving like normal, you should call the office or go to MAU.  Any other obstetric concerns.   

## 2020-07-14 NOTE — Progress Notes (Addendum)
   PRENATAL VISIT NOTE  Subjective:  Veronica Castillo is a 30 y.o. G1P0000 at [redacted]w[redacted]d being seen today for ongoing prenatal care.  She is currently monitored for the following issues for this high-risk pregnancy and has Supervision of high risk pregnancy in third trimester; Positive urine drug screen 02/15/20 MJ; and Chronic hypertension affecting pregnancy on their problem list.  Patient reports no complaints.  Patient denies any headaches, visual symptoms, RUQ/epigastric pain or other concerning symptoms. Contractions: Not present. Vag. Bleeding: None.  Movement: Present. Denies leaking of fluid.   The following portions of the patient's history were reviewed and updated as appropriate: allergies, current medications, past family history, past medical history, past social history, past surgical history and problem list.   Objective:   Vitals:   07/14/20 1436 07/14/20 1456  BP: (!) 159/96 (!) 158/103  Pulse: 80   Weight: 197 lb (89.4 kg)     Fetal Status: Fetal Heart Rate (bpm): 152   Movement: Present     General:  Alert, oriented and cooperative. Patient is in no acute distress.  Skin: Skin is warm and dry. No rash noted.   Cardiovascular: Normal heart rate noted  Respiratory: Normal respiratory effort, no problems with respiration noted  Abdomen: Soft, gravid, appropriate for gestational age.  Pain/Pressure: Absent     Pelvic: Cervical exam deferred        Extremities: Normal range of motion.  Edema: Trace  Mental Status: Normal mood and affect. Normal behavior. Normal judgment and thought content.   Assessment and Plan:  Pregnancy: G1P0000 at [redacted]w[redacted]d 1. Chronic hypertension affecting pregnancy No PEC symptoms.  Will start medication today, PEC precautions advised.  Will start antenatal testing weekly, BPP added on to 07/19/20 growth scan.  Discussed need for antenatal testing and frequent ultrasounds/prenatal visits, need for optimizing BP control to decrease CHTN/preeclampsia  associated maternal-fetal morbidity and mortality. Will check surveillance labs today. - CBC - Comprehensive metabolic panel - Protein / creatinine ratio, urine - [place] MFM FETAL BPP WO NON STRESS; Future - NIFEdipine (PROCARDIA-XL/NIFEDICAL-XL) 30 MG 24 hr tablet; Take 1 tablet (30 mg total) by mouth daily.  Dispense: 30 tablet; Refill: 2  2. [redacted] weeks gestation of pregnancy 3. Supervision of high risk pregnancy in third trimester Preterm labor symptoms and general obstetric precautions including but not limited to vaginal bleeding, contractions, leaking of fluid and fetal movement were reviewed in detail with the patient. Please refer to After Visit Summary for other counseling recommendations.   Return in about 2 weeks (around 07/28/2020) for OFFICE OB VISIT (MD only).  Future Appointments  Date Time Provider Department Center  07/19/2020  4:00 PM [facility]    Jaynie Collins, MD

## 2020-07-15 LAB — PROTEIN / CREATININE RATIO, URINE
Creatinine, Urine: 232.3 mg/dL
Protein, Ur: 30.8 mg/dL
Protein/Creat Ratio: 133 mg/g creat (ref 0–200)

## 2020-07-15 LAB — CBC
Hematocrit: 32.1 % — ABNORMAL LOW (ref 34.0–46.6)
Hemoglobin: 10.9 g/dL — ABNORMAL LOW (ref 11.1–15.9)
MCH: 28.6 pg (ref 26.6–33.0)
MCHC: 34 g/dL (ref 31.5–35.7)
MCV: 84 fL (ref 79–97)
Platelets: 296 10*3/uL (ref 150–450)
RBC: 3.81 x10E6/uL (ref 3.77–5.28)
RDW: 12.8 % (ref 11.7–15.4)
WBC: 11.5 10*3/uL — ABNORMAL HIGH (ref 3.4–10.8)

## 2020-07-15 LAB — COMPREHENSIVE METABOLIC PANEL
ALT: 10 IU/L (ref 0–32)
AST: 14 IU/L (ref 0–40)
Albumin/Globulin Ratio: 1.1 — ABNORMAL LOW (ref 1.2–2.2)
Albumin: 3.3 g/dL — ABNORMAL LOW (ref 3.9–5.0)
Alkaline Phosphatase: 122 IU/L — ABNORMAL HIGH (ref 44–121)
BUN/Creatinine Ratio: 11 (ref 9–23)
BUN: 6 mg/dL (ref 6–20)
Bilirubin Total: <0.2 mg/dL (ref 0.0–1.2)
CO2: 18 mmol/L — ABNORMAL LOW (ref 20–29)
Calcium: 9.3 mg/dL (ref 8.7–10.2)
Chloride: 105 mmol/L (ref 96–106)
Creatinine, Ser: 0.57 mg/dL (ref 0.57–1.00)
Globulin, Total: 3 g/dL (ref 1.5–4.5)
Glucose: 127 mg/dL — ABNORMAL HIGH (ref 65–99)
Potassium: 3.6 mmol/L (ref 3.5–5.2)
Sodium: 137 mmol/L (ref 134–144)
Total Protein: 6.3 g/dL (ref 6.0–8.5)
eGFR: 128 mL/min/{1.73_m2} (ref >59)

## 2020-07-19 ENCOUNTER — Ambulatory Visit: Payer: BC Managed Care – PPO | Attending: Maternal & Fetal Medicine

## 2020-07-19 ENCOUNTER — Other Ambulatory Visit: Payer: Self-pay

## 2020-07-19 DIAGNOSIS — Z3A33 33 weeks gestation of pregnancy: Secondary | ICD-10-CM | POA: Insufficient documentation

## 2020-07-19 DIAGNOSIS — O10913 Unspecified pre-existing hypertension complicating pregnancy, third trimester: Secondary | ICD-10-CM

## 2020-07-19 DIAGNOSIS — O10013 Pre-existing essential hypertension complicating pregnancy, third trimester: Secondary | ICD-10-CM | POA: Insufficient documentation

## 2020-07-19 DIAGNOSIS — O10919 Unspecified pre-existing hypertension complicating pregnancy, unspecified trimester: Secondary | ICD-10-CM

## 2020-07-19 DIAGNOSIS — O0993 Supervision of high risk pregnancy, unspecified, third trimester: Secondary | ICD-10-CM | POA: Diagnosis not present

## 2020-07-20 ENCOUNTER — Ambulatory Visit
Admission: EM | Admit: 2020-07-20 | Discharge: 2020-07-20 | Disposition: A | Discharge status: Home / Self Care | Payer: BC Managed Care – PPO | Attending: Internal Medicine | Admitting: Internal Medicine

## 2020-07-20 ENCOUNTER — Telehealth: Payer: Self-pay

## 2020-07-20 DIAGNOSIS — S00522A Blister (nonthermal) of oral cavity, initial encounter: Secondary | ICD-10-CM

## 2020-07-20 DIAGNOSIS — K0510 Chronic gingivitis, plaque induced: Secondary | ICD-10-CM | POA: Diagnosis not present

## 2020-07-20 DIAGNOSIS — K047 Periapical abscess without sinus: Secondary | ICD-10-CM

## 2020-07-20 MED ORDER — AMOXICILLIN 875 MG PO TABS: 875.0000 mg | ORAL_TABLET | Freq: Two times a day (BID) | ORAL | 0 refills | Status: DC

## 2020-07-20 NOTE — ED Triage Notes (Signed)
Pt reports having L sided dental pain x4 days. Tried otc meds without relief. [redacted]weeks pregnant

## 2020-07-20 NOTE — Telephone Encounter (Signed)
Pt sent my chart message (see message). Called pt and explained she is doing everything correctly. Pt states she will reach out to dentist tomorrow when they open. Pt has no further questions.

## 2020-07-20 NOTE — Telephone Encounter (Signed)
I spoke with this patient this morning when she sent the first message and explained she was doing everything I would advise her to do. I did advise her to call her dentist back and see if they had an emergency line. I am assuming they did since she just sent this second message. I am leaving this in the box for you because I do not have a doctor here to run this by and I don't even know what to say back to her.
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
## 2020-07-28 ENCOUNTER — Other Ambulatory Visit: Payer: Self-pay

## 2020-07-28 ENCOUNTER — Ambulatory Visit: Payer: BC Managed Care – PPO | Admitting: *Deleted

## 2020-07-28 ENCOUNTER — Other Ambulatory Visit: Payer: Self-pay | Admitting: Obstetrics and Gynecology

## 2020-07-28 ENCOUNTER — Ambulatory Visit (INDEPENDENT_AMBULATORY_CARE_PROVIDER_SITE_OTHER): Payer: BC Managed Care – PPO

## 2020-07-28 ENCOUNTER — Ambulatory Visit (INDEPENDENT_AMBULATORY_CARE_PROVIDER_SITE_OTHER): Payer: BC Managed Care – PPO | Admitting: Obstetrics and Gynecology

## 2020-07-28 VITALS — BP 153/104 | HR 83 | Wt 200.0 lb

## 2020-07-28 VITALS — BP 158/105 | HR 87 | Wt 200.0 lb

## 2020-07-28 DIAGNOSIS — O1403 Mild to moderate pre-eclampsia, third trimester: Secondary | ICD-10-CM

## 2020-07-28 DIAGNOSIS — Z7982 Long term (current) use of aspirin: Secondary | ICD-10-CM

## 2020-07-28 DIAGNOSIS — Z3A34 34 weeks gestation of pregnancy: Secondary | ICD-10-CM

## 2020-07-28 DIAGNOSIS — O10919 Unspecified pre-existing hypertension complicating pregnancy, unspecified trimester: Secondary | ICD-10-CM

## 2020-07-28 DIAGNOSIS — O0993 Supervision of high risk pregnancy, unspecified, third trimester: Secondary | ICD-10-CM

## 2020-07-28 DIAGNOSIS — Z3A32 32 weeks gestation of pregnancy: Secondary | ICD-10-CM

## 2020-07-28 LAB — POCT URINALYSIS DIPSTICK OB

## 2020-07-28 LAB — COMPREHENSIVE METABOLIC PANEL
ALT: 13 IU/L (ref 0–32)
AST: 22 IU/L (ref 0–40)
Albumin/Globulin Ratio: 1.1 — ABNORMAL LOW (ref 1.2–2.2)
Albumin: 3.4 g/dL — ABNORMAL LOW (ref 3.9–5.0)
Alkaline Phosphatase: 154 IU/L — ABNORMAL HIGH (ref 44–121)
BUN/Creatinine Ratio: 13 (ref 9–23)
BUN: 9 mg/dL (ref 6–20)
Bilirubin Total: <0.2 mg/dL (ref 0.0–1.2)
CO2: 17 mmol/L — ABNORMAL LOW (ref 20–29)
Calcium: 9.6 mg/dL (ref 8.7–10.2)
Chloride: 103 mmol/L (ref 96–106)
Creatinine, Ser: 0.68 mg/dL (ref 0.57–1.00)
Globulin, Total: 3 g/dL (ref 1.5–4.5)
Glucose: 87 mg/dL (ref 65–99)
Potassium: 4.4 mmol/L (ref 3.5–5.2)
Sodium: 135 mmol/L (ref 134–144)
Total Protein: 6.4 g/dL (ref 6.0–8.5)
eGFR: 122 mL/min/{1.73_m2} (ref >59)

## 2020-07-28 LAB — CBC
Hematocrit: 34 % (ref 34.0–46.6)
Hemoglobin: 11.7 g/dL (ref 11.1–15.9)
MCH: 29.4 pg (ref 26.6–33.0)
MCHC: 34.4 g/dL (ref 31.5–35.7)
MCV: 85 fL (ref 79–97)
Platelets: 324 10*3/uL (ref 150–450)
RBC: 3.98 x10E6/uL (ref 3.77–5.28)
RDW: 13.1 % (ref 11.7–15.4)
WBC: 10.4 10*3/uL (ref 3.4–10.8)

## 2020-07-28 MED ORDER — NIFEDIPINE ER OSMOTIC RELEASE 30 MG PO TB24: 30.0000 mg | ORAL_TABLET | Freq: Two times a day (BID) | ORAL | 2 refills | Status: DC

## 2020-07-28 MED ORDER — PANTOPRAZOLE SODIUM 40 MG PO TBEC: 40.0000 mg | DELAYED_RELEASE_TABLET | Freq: Every day | ORAL | 0 refills | Status: DC

## 2020-07-28 NOTE — Progress Notes (Signed)
Pt informed that the ultrasound is considered a limited OB ultrasound and is not intended to be a complete ultrasound exam.  Patient also informed that the ultrasound is not being completed with the intent of assessing for fetal or placental anomalies or any pelvic abnormalities.  Explained that the purpose of today's ultrasound is to assess for presentation, BPP and amniotic fluid volume.  Patient acknowledges the purpose of the exam and the limitations of the study.   Pt denies H/A or visual disturbances.  She stated that she took dose of Nifedipine today @ 10 am as instructed. Pt also stated that she has been taking ASA 81 mg - 4 tablets every 4 hours daily since March. This is the manufacturer's instructions on the OTC bottle she purchases. Pt was instructed to take only one tablet once daily. She denies symptoms of stomach pain, heartburn or reflux.  Dr. Vergie Living was notified of BP values and dosage of ASA 81 mg which pt has been taking.

## 2020-07-28 NOTE — Addendum Note (Signed)
Addended by: Jill Side on: 07/28/2020 04:02 PM   Modules accepted: Orders

## 2020-07-28 NOTE — Progress Notes (Addendum)
PRENATAL VISIT NOTE  Subjective:  Veronica Castillo is a 30 y.o. G1P0000 at [redacted]w[redacted]d being seen today for ongoing prenatal care.  She is currently monitored for the following issues for this high-risk pregnancy and has Supervision of high risk pregnancy in third trimester; Positive urine drug screen 02/15/20 MJ; and Chronic hypertension affecting pregnancy on their problem list.  Patient reports no complaints.  Contractions: Not present. Vag. Bleeding: None.  Movement: Present. Denies leaking of fluid.   The following portions of the patient's history were reviewed and updated as appropriate: allergies, current medications, past family history, past medical history, past social history, past surgical history and problem list.   Objective:   Vitals:   07/28/20 0933 07/28/20 0934  BP: (!) 161/108 (!) 158/105  Pulse: 90 87  Weight: 200 lb (90.7 kg)     Fetal Status: Fetal Heart Rate (bpm): 161   Movement: Present     General:  Alert, oriented and cooperative. Patient is in no acute distress.  Skin: Skin is warm and dry. No rash noted.   Cardiovascular: Normal heart rate noted  Respiratory: Normal respiratory effort, no problems with respiration noted  Abdomen: Soft, gravid, appropriate for gestational age.  Pain/Pressure: Absent     Pelvic: Cervical exam deferred        Extremities: Normal range of motion.     Mental Status: Normal mood and affect. Normal behavior. Normal judgment and thought content.  Normal s1 and s2, no mrgs Ctab No abdominal pain  Assessment and Plan:  Pregnancy: G1P0000 at [redacted]w[redacted]d 1. Chronic hypertension affecting pregnancy Patient takes her procardia xl 30 qday in the late morning. I told her I recommend taking it around 8 or 9am and to take it now and will follow up her BP at her NST/BPP at 115 today; u/s office aware to let me know if BP is >150/100. Will check labs today. Udip with protein and blood and leuks. Will send formal pc ratio and urinalysis. She states  her BPs at home are in the 130s-140s/90s range and that is what she was in the ED last week; she was seen for dental pain and put on amox.   7/19: afi 9, ceph, 8/8, 22%, 2001gm, ac 28%  I also told her that she is likely going to be a 38wk-39wk IOL patient given we started her on meds during the pregnancy but will finalize around 35-36wks .  Continue qwk testing  Pre-eclampsia precautions given. Pt confirms on low dose asa  - CBC - Comprehensive metabolic panel - Protein / creatinine ratio, urine - Culture, OB Urine - Urinalysis, Routine w reflex microscopic  Preterm labor symptoms and general obstetric precautions including but not limited to vaginal bleeding, contractions, leaking of fluid and fetal movement were reviewed in detail with the patient. Please refer to After Visit Summary for other counseling recommendations.   Return in about 1 week (around 08/04/2020) for in person, md visit, high risk ob.  Future Appointments  Date Time Provider Department Center  07/28/2020  1:15 PM [facility] NST [facility]  08/04/2020  1:15 PM [facility]ST [facility]  08/11/2020  9:55 AM Satsuma Bing, MD [facility]  08/11/2020  2:15 PM [facility]ST [facility]  08/18/2020  9:15 AM Reva Bores, MD [facility]  08/18/2020 11:15 AM [facility] NST [facility]  08/18/2020  2:00 PM [facility]  08/25/2020  8:55 AM Reva Bores, MD [facility]  09/01/2020  8:15  AM Ahmeek Bing, MD [facility]    Taliaferro Bing, MD   ADDENDUM BPP 10/10 and BP 152/105 and 153/104 and took her procardia 30 at 10am.  I told the clinic to increase her to procardia 30 bid from qday. Follow up 8/4 bpp/nst visit for BP.  Clinic also found out pt was taking asa 81mg  4tabs q4h qday since March. I sent a note to MFM to see if anything else to be on the look out for but I told her to drop down to 81mg  qday and will put on a PPI.  April  MD Attending Center for (Faculty Practice) 07/29/2020 Time: 1400

## 2020-07-29 DIAGNOSIS — Z7982 Long term (current) use of aspirin: Secondary | ICD-10-CM | POA: Insufficient documentation

## 2020-07-29 LAB — URINALYSIS, ROUTINE W REFLEX MICROSCOPIC
Bilirubin, UA: NEGATIVE
Glucose, UA: NEGATIVE
Ketones, UA: NEGATIVE
Nitrite, UA: NEGATIVE
RBC, UA: NEGATIVE
Specific Gravity, UA: 1.012 (ref 1.005–1.030)
Urobilinogen, Ur: 0.2 mg/dL (ref 0.2–1.0)
pH, UA: 6 (ref 5.0–7.5)

## 2020-07-29 LAB — MICROSCOPIC EXAMINATION
Bacteria, UA: NONE SEEN
Casts: NONE SEEN /lpf
Epithelial Cells (non renal): >10 /hpf — AB (ref 0–10)

## 2020-07-29 LAB — PROTEIN / CREATININE RATIO, URINE
Creatinine, Urine: 105.2 mg/dL
Protein, Ur: 68.6 mg/dL
Protein/Creat Ratio: 652 mg/g creat — ABNORMAL HIGH (ref 0–200)

## 2020-07-30 LAB — URINE CULTURE, OB REFLEX

## 2020-07-30 LAB — CULTURE, OB URINE

## 2020-08-01 ENCOUNTER — Encounter: Payer: Self-pay | Admitting: Obstetrics and Gynecology

## 2020-08-01 DIAGNOSIS — O1414 Severe pre-eclampsia complicating childbirth: Secondary | ICD-10-CM | POA: Insufficient documentation

## 2020-08-01 DIAGNOSIS — O1403 Mild to moderate pre-eclampsia, third trimester: Secondary | ICD-10-CM | POA: Insufficient documentation

## 2020-08-01 NOTE — Addendum Note (Signed)
Addended by: [place] Bing on: 08/01/2020 09:14 AM   Modules accepted: Orders

## 2020-08-04 ENCOUNTER — Other Ambulatory Visit: Payer: Self-pay

## 2020-08-04 ENCOUNTER — Other Ambulatory Visit: Payer: BC Managed Care – PPO

## 2020-08-04 ENCOUNTER — Encounter: Payer: Self-pay | Admitting: Obstetrics and Gynecology

## 2020-08-04 ENCOUNTER — Ambulatory Visit (INDEPENDENT_AMBULATORY_CARE_PROVIDER_SITE_OTHER): Payer: BC Managed Care – PPO

## 2020-08-04 ENCOUNTER — Encounter (HOSPITAL_COMMUNITY): Payer: Self-pay | Admitting: Obstetrics and Gynecology

## 2020-08-04 ENCOUNTER — Inpatient Hospital Stay (HOSPITAL_COMMUNITY)
Admission: AD | Admit: 2020-08-04 | Discharge: 2020-08-10 | DRG: 787 | Disposition: A | Discharge status: Home / Self Care | Payer: BC Managed Care – PPO | Attending: Obstetrics & Gynecology | Admitting: Obstetrics & Gynecology

## 2020-08-04 ENCOUNTER — Ambulatory Visit: Payer: BC Managed Care – PPO | Admitting: *Deleted

## 2020-08-04 VITALS — BP 172/109

## 2020-08-04 DIAGNOSIS — O114 Pre-existing hypertension with pre-eclampsia, complicating childbirth: Principal | ICD-10-CM | POA: Diagnosis present

## 2020-08-04 DIAGNOSIS — Z3A35 35 weeks gestation of pregnancy: Secondary | ICD-10-CM

## 2020-08-04 DIAGNOSIS — O9081 Anemia of the puerperium: Secondary | ICD-10-CM | POA: Diagnosis not present

## 2020-08-04 DIAGNOSIS — O99214 Obesity complicating childbirth: Secondary | ICD-10-CM | POA: Diagnosis present

## 2020-08-04 DIAGNOSIS — Z7982 Long term (current) use of aspirin: Secondary | ICD-10-CM

## 2020-08-04 DIAGNOSIS — O1410 Severe pre-eclampsia, unspecified trimester: Secondary | ICD-10-CM

## 2020-08-04 DIAGNOSIS — O1403 Mild to moderate pre-eclampsia, third trimester: Secondary | ICD-10-CM

## 2020-08-04 DIAGNOSIS — D62 Acute posthemorrhagic anemia: Secondary | ICD-10-CM | POA: Diagnosis not present

## 2020-08-04 DIAGNOSIS — O99324 Drug use complicating childbirth: Secondary | ICD-10-CM | POA: Diagnosis present

## 2020-08-04 DIAGNOSIS — Z8616 Personal history of COVID-19: Secondary | ICD-10-CM

## 2020-08-04 DIAGNOSIS — O10919 Unspecified pre-existing hypertension complicating pregnancy, unspecified trimester: Secondary | ICD-10-CM

## 2020-08-04 DIAGNOSIS — O99210 Obesity complicating pregnancy, unspecified trimester: Secondary | ICD-10-CM | POA: Diagnosis present

## 2020-08-04 DIAGNOSIS — O1002 Pre-existing essential hypertension complicating childbirth: Secondary | ICD-10-CM | POA: Diagnosis present

## 2020-08-04 DIAGNOSIS — O1414 Severe pre-eclampsia complicating childbirth: Secondary | ICD-10-CM | POA: Diagnosis present

## 2020-08-04 DIAGNOSIS — Z20822 Contact with and (suspected) exposure to covid-19: Secondary | ICD-10-CM | POA: Diagnosis present

## 2020-08-04 DIAGNOSIS — Z23 Encounter for immunization: Secondary | ICD-10-CM | POA: Diagnosis not present

## 2020-08-04 DIAGNOSIS — O119 Pre-existing hypertension with pre-eclampsia, unspecified trimester: Secondary | ICD-10-CM | POA: Diagnosis present

## 2020-08-04 DIAGNOSIS — F1210 Cannabis abuse, uncomplicated: Secondary | ICD-10-CM | POA: Diagnosis present

## 2020-08-04 DIAGNOSIS — Z3A36 36 weeks gestation of pregnancy: Secondary | ICD-10-CM | POA: Diagnosis not present

## 2020-08-04 DIAGNOSIS — Z98891 History of uterine scar from previous surgery: Secondary | ICD-10-CM

## 2020-08-04 HISTORY — DX: Severe pre-eclampsia, unspecified trimester: O14.10

## 2020-08-04 LAB — COMPREHENSIVE METABOLIC PANEL
ALT: 19 U/L (ref 0–44)
AST: 30 U/L (ref 15–41)
Albumin: 2.5 g/dL — ABNORMAL LOW (ref 3.5–5.0)
Alkaline Phosphatase: 112 U/L (ref 38–126)
Anion gap: 9 (ref 5–15)
BUN: 10 mg/dL (ref 6–20)
CO2: 19 mmol/L — ABNORMAL LOW (ref 22–32)
Calcium: 8.7 mg/dL — ABNORMAL LOW (ref 8.9–10.3)
Chloride: 107 mmol/L (ref 98–111)
Creatinine, Ser: 0.63 mg/dL (ref 0.44–1.00)
GFR, Estimated: >60 mL/min (ref >60)
Glucose, Bld: 67 mg/dL — ABNORMAL LOW (ref 70–99)
Potassium: 3.7 mmol/L (ref 3.5–5.1)
Sodium: 135 mmol/L (ref 135–145)
Total Bilirubin: 0.6 mg/dL (ref 0.3–1.2)
Total Protein: 6.2 g/dL — ABNORMAL LOW (ref 6.5–8.1)

## 2020-08-04 LAB — RESP PANEL BY RT-PCR (FLU A&B, COVID) ARPGX2
Influenza A by PCR: NEGATIVE
Influenza B by PCR: NEGATIVE
SARS Coronavirus 2 by RT PCR: NEGATIVE

## 2020-08-04 LAB — URINALYSIS, ROUTINE W REFLEX MICROSCOPIC
Bilirubin Urine: NEGATIVE
Glucose, UA: NEGATIVE mg/dL
Hgb urine dipstick: NEGATIVE
Ketones, ur: NEGATIVE mg/dL
Leukocytes,Ua: NEGATIVE
Nitrite: NEGATIVE
Protein, ur: >=300 mg/dL — AB
Specific Gravity, Urine: 1.014 (ref 1.005–1.030)
pH: 6 (ref 5.0–8.0)

## 2020-08-04 LAB — TYPE AND SCREEN
ABO/RH(D): B POS
Antibody Screen: NEGATIVE

## 2020-08-04 LAB — CBC
HCT: 35.5 % — ABNORMAL LOW (ref 36.0–46.0)
Hemoglobin: 11.6 g/dL — ABNORMAL LOW (ref 12.0–15.0)
MCH: 28.2 pg (ref 26.0–34.0)
MCHC: 32.7 g/dL (ref 30.0–36.0)
MCV: 86.4 fL (ref 80.0–100.0)
Platelets: 385 10*3/uL (ref 150–400)
RBC: 4.11 MIL/uL (ref 3.87–5.11)
RDW: 13.4 % (ref 11.5–15.5)
WBC: 11.5 10*3/uL — ABNORMAL HIGH (ref 4.0–10.5)
nRBC: 0 % (ref 0.0–0.2)

## 2020-08-04 LAB — PROTEIN / CREATININE RATIO, URINE
Creatinine, Urine: 140.96 mg/dL
Protein Creatinine Ratio: 8.35 mg/mg{Cre} — ABNORMAL HIGH (ref 0.00–0.15)
Total Protein, Urine: 1177 mg/dL

## 2020-08-04 LAB — GROUP B STREP BY PCR: Group B strep by PCR: NEGATIVE

## 2020-08-04 MED ORDER — MAGNESIUM SULFATE BOLUS VIA INFUSION
4.0000 g | Freq: Once | INTRAVENOUS | Status: AC
  Administered 2020-08-04: 4 g via INTRAVENOUS
  Filled 2020-08-04: qty 1000

## 2020-08-04 MED ORDER — OXYCODONE-ACETAMINOPHEN 5-325 MG PO TABS: 1.0000 | ORAL_TABLET | ORAL | Status: DC | PRN

## 2020-08-04 MED ORDER — NIFEDIPINE ER OSMOTIC RELEASE 30 MG PO TB24
30.0000 mg | ORAL_TABLET | Freq: Once | ORAL | Status: AC
  Administered 2020-08-04: 30 mg via ORAL
  Filled 2020-08-04: qty 1

## 2020-08-04 MED ORDER — MISOPROSTOL 50MCG HALF TABLET
50.0000 ug | ORAL_TABLET | ORAL | Status: AC | PRN
Start: 2020-08-04 — End: 2020-08-05
  Administered 2020-08-04 – 2020-08-05 (×4): 50 ug via BUCCAL
  Filled 2020-08-04 (×4): qty 1

## 2020-08-04 MED ORDER — ONDANSETRON HCL 4 MG/2ML IJ SOLN
4.0000 mg | Freq: Four times a day (QID) | INTRAMUSCULAR | Status: DC | PRN
  Administered 2020-08-05 – 2020-08-06 (×2): 4 mg via INTRAVENOUS
  Filled 2020-08-04 (×2): qty 2

## 2020-08-04 MED ORDER — TERBUTALINE SULFATE 1 MG/ML IJ SOLN: 0.2500 mg | Freq: Once | INTRAMUSCULAR | Status: DC | PRN

## 2020-08-04 MED ORDER — LABETALOL HCL 5 MG/ML IV SOLN
40.0000 mg | INTRAVENOUS | Status: DC | PRN
  Filled 2020-08-04: qty 8

## 2020-08-04 MED ORDER — LACTATED RINGERS IV SOLN
500.0000 mL | INTRAVENOUS | Status: DC | PRN
  Administered 2020-08-06: 250 mL via INTRAVENOUS

## 2020-08-04 MED ORDER — HYDRALAZINE HCL 20 MG/ML IJ SOLN: 10.0000 mg | INTRAMUSCULAR | Status: DC | PRN

## 2020-08-04 MED ORDER — OXYTOCIN BOLUS FROM INFUSION: 333.0000 mL | Freq: Once | INTRAVENOUS | Status: DC

## 2020-08-04 MED ORDER — ZOLPIDEM TARTRATE 5 MG PO TABS
5.0000 mg | ORAL_TABLET | Freq: Every evening | ORAL | Status: DC | PRN
  Administered 2020-08-04 – 2020-08-05 (×2): 5 mg via ORAL
  Filled 2020-08-04 (×2): qty 1

## 2020-08-04 MED ORDER — LACTATED RINGERS IV SOLN: INTRAVENOUS | Status: DC

## 2020-08-04 MED ORDER — SODIUM CHLORIDE 0.9 % IV SOLN
5.0000 10*6.[IU] | Freq: Once | INTRAVENOUS | Status: AC
  Administered 2020-08-04: 5 10*6.[IU] via INTRAVENOUS
  Filled 2020-08-04: qty 5

## 2020-08-04 MED ORDER — LABETALOL HCL 5 MG/ML IV SOLN
INTRAVENOUS | Status: AC
  Administered 2020-08-04: 40 mg via INTRAVENOUS
  Filled 2020-08-04: qty 4

## 2020-08-04 MED ORDER — NIFEDIPINE ER OSMOTIC RELEASE 30 MG PO TB24
30.0000 mg | ORAL_TABLET | Freq: Two times a day (BID) | ORAL | Status: DC
  Administered 2020-08-04: 30 mg via ORAL
  Filled 2020-08-04 (×2): qty 1

## 2020-08-04 MED ORDER — OXYCODONE-ACETAMINOPHEN 5-325 MG PO TABS: 2.0000 | ORAL_TABLET | ORAL | Status: DC | PRN

## 2020-08-04 MED ORDER — OXYTOCIN-SODIUM CHLORIDE 30-0.9 UT/500ML-% IV SOLN
2.5000 [IU]/h | INTRAVENOUS | Status: DC
Start: 2020-08-04 — End: 2020-08-07

## 2020-08-04 MED ORDER — LABETALOL HCL 5 MG/ML IV SOLN
20.0000 mg | INTRAVENOUS | Status: DC | PRN
  Administered 2020-08-04: 20 mg via INTRAVENOUS

## 2020-08-04 MED ORDER — LIDOCAINE HCL (PF) 1 % IJ SOLN: 30.0000 mL | INTRAMUSCULAR | Status: DC | PRN

## 2020-08-04 MED ORDER — LABETALOL HCL 5 MG/ML IV SOLN: 80.0000 mg | INTRAVENOUS | Status: DC | PRN

## 2020-08-04 MED ORDER — MAGNESIUM SULFATE 40 GM/1000ML IV SOLN
2.0000 g/h | INTRAVENOUS | Status: DC
  Administered 2020-08-05 – 2020-08-06 (×3): 2 g/h via INTRAVENOUS
  Filled 2020-08-04 (×4): qty 1000

## 2020-08-04 MED ORDER — ACETAMINOPHEN 325 MG PO TABS
650.0000 mg | ORAL_TABLET | ORAL | Status: DC | PRN
  Administered 2020-08-04 (×2): 650 mg via ORAL
  Filled 2020-08-04 (×2): qty 2

## 2020-08-04 MED ORDER — NIFEDIPINE ER OSMOTIC RELEASE 60 MG PO TB24
60.0000 mg | ORAL_TABLET | Freq: Two times a day (BID) | ORAL | Status: DC
  Administered 2020-08-05 – 2020-08-08 (×8): 60 mg via ORAL
  Filled 2020-08-04 (×9): qty 1

## 2020-08-04 MED ORDER — NIFEDIPINE ER OSMOTIC RELEASE 30 MG PO TB24: 30.0000 mg | ORAL_TABLET | Freq: Every day | ORAL | Status: DC

## 2020-08-04 MED ORDER — BUTALBITAL-APAP-CAFFEINE 50-325-40 MG PO TABS
1.0000 | ORAL_TABLET | Freq: Four times a day (QID) | ORAL | Status: DC | PRN
  Administered 2020-08-04: 1 via ORAL
  Filled 2020-08-04: qty 1

## 2020-08-04 MED ORDER — LABETALOL HCL 5 MG/ML IV SOLN
20.0000 mg | INTRAVENOUS | Status: DC | PRN
  Administered 2020-08-04 (×2): 20 mg via INTRAVENOUS
  Filled 2020-08-04 (×3): qty 4

## 2020-08-04 MED ORDER — SOD CITRATE-CITRIC ACID 500-334 MG/5ML PO SOLN
30.0000 mL | ORAL | Status: DC | PRN
  Administered 2020-08-07: 30 mL via ORAL
  Filled 2020-08-04: qty 30

## 2020-08-04 MED ORDER — PENICILLIN G POT IN DEXTROSE 60000 UNIT/ML IV SOLN
3.0000 10*6.[IU] | INTRAVENOUS | Status: DC
  Administered 2020-08-04 – 2020-08-06 (×13): 3 10*6.[IU] via INTRAVENOUS
  Filled 2020-08-04 (×13): qty 50

## 2020-08-04 MED ORDER — LABETALOL HCL 5 MG/ML IV SOLN: 40.0000 mg | INTRAVENOUS | Status: DC | PRN

## 2020-08-04 NOTE — Progress Notes (Signed)
Report called to C. Barbarann Ehlers, RN, Press photographer.  Room assignment given, 205.
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
## 2020-08-04 NOTE — Progress Notes (Signed)
  Per Dr. Judeth Cornfield, unlikely to be any issues but mfm will check ductal arch for closure given high dose aspirin usage during pregnancy  Cornelia Copa MD Attending Center for [facility] (Faculty Practice) 08/04/2020 Time: [phone]

## 2020-08-04 NOTE — Progress Notes (Signed)
Labor Progress Note Veronica Castillo is a 30 y.o. G1P0000 at 61w4dpresented for IOL cHTN SI PreE w/ SF S:  Patient is still feeling good, she had a mild headache earlier which resolved with tylenol. Otherwise denies vision changes, chest pain, SOB or RUQ pain  O:  BP (!) 139/94   Pulse 93   Temp 98.6 F (37 C) (Oral)   Resp 16   Ht 5' 6"  (1.676 m)   Wt 95 kg   LMP 11/29/2019 (Exact Date)   SpO2 100%   BMI 33.80 kg/m  EFM: 145bpm/moderate variability/+accels, no decels  Toco: q2-3 min  CVE: Dilation: 1 Effacement (%): Thick Cervical Position: Posterior Station: -2 Presentation: Vertex Exam by:: DRosana Hoes RNf   A&P: 30y.o. G1P0000 374w4dOL cHTN SI PreE w/ SF on magnesium #Labor: S/p Cytotec x 2, Will attempt foley next check if less posterior #Pain: PRN #FWB: Cat 1 #GBS  Unknown - sent culture #cHTN SI PreE w/SF: Met criteria with multiple severe blood pressures. On magnesium and PRN labetolol and hydralazine for severe range blood pressures. Continue patients home procardia. Has had multiple severe range pressures and received labetolol x 3. Last pressure 139/94  AnWaldon MerlMD 5:55 PM
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
## 2020-08-04 NOTE — MAU Note (Signed)
Sent from MD office for BP evaluation.  Denies H/A, visual disturbances, and epigastric pain.  Endorses +FM.  Denies VB or LOF. 

## 2020-08-04 NOTE — Progress Notes (Signed)
Pt informed that the ultrasound is considered a limited OB ultrasound and is not intended to be a complete ultrasound exam.  Patient also informed that the ultrasound is not being completed with the intent of assessing for fetal or placental anomalies or any pelvic abnormalities.  Explained that the purpose of today's ultrasound is to assess for presentation, BPP and amniotic fluid volume.  Patient acknowledges the purpose of the exam and the limitations of the study.    Pt denies H/A or visual disturbances.  Pt brought her BP monitor for comparison - 154/102.  Consult completed with Dr. Donavan Foil. Pt was advised to go to MAU now for further evaluation and possible admission/delivery. She voiced understanding and agreed to plan of care.

## 2020-08-04 NOTE — Progress Notes (Signed)
Labor Progress Note Veronica Castillo is a 30 y.o. G1P0000 at 49w4dpresented for IOL cHTN SI PreE w/ SF S:  Patient is still feeling good, she had a mild headache earlier which resolved with tylenol. Otherwise denies vision changes, chest pain, SOB or RUQ pain  O:  BP (!) 154/101   Pulse 86   Temp 98.6 F (37 C) (Oral)   Resp 18   Ht 5' 6"  (1.676 m)   Wt 95 kg   LMP 11/29/2019 (Exact Date)   SpO2 100%   BMI 33.80 kg/m  EFM: 145bpm/moderate variability/+accels, no decels 1742 and likely do another cytotec  CVE:  fingertip/thick/-3 previous check   A&P: 30y.o. G1P0000 38w4dOL cHTN SI PreE w/ SF on magnesium #Labor: S/p Cytotec x 1, will recheck at  #Pain: PRN #FWB: Cat 1 #GBS  Unknown - sent culture #cHTN SI PreE w/SF: Met criteria with multiple severe blood pressures. On magnesium and PRN labetolol and hydralazine for severe range blood pressures. Continue patients home procardia. Has had multiple severe range pressures and received labetolol x 3  AnWaldon MerlMD 4:02 PM

## 2020-08-05 ENCOUNTER — Encounter (HOSPITAL_COMMUNITY): Payer: Self-pay | Admitting: Obstetrics & Gynecology

## 2020-08-05 LAB — CULTURE, BETA STREP (GROUP B ONLY)

## 2020-08-05 LAB — CBC
HCT: 31.9 % — ABNORMAL LOW (ref 36.0–46.0)
Hemoglobin: 10.7 g/dL — ABNORMAL LOW (ref 12.0–15.0)
MCH: 28.5 pg (ref 26.0–34.0)
MCHC: 33.5 g/dL (ref 30.0–36.0)
MCV: 85.1 fL (ref 80.0–100.0)
Platelets: 346 10*3/uL (ref 150–400)
RBC: 3.75 MIL/uL — ABNORMAL LOW (ref 3.87–5.11)
RDW: 13.5 % (ref 11.5–15.5)
WBC: 14 10*3/uL — ABNORMAL HIGH (ref 4.0–10.5)
nRBC: 0 % (ref 0.0–0.2)

## 2020-08-05 LAB — COMPREHENSIVE METABOLIC PANEL
ALT: 17 U/L (ref 0–44)
AST: 25 U/L (ref 15–41)
Albumin: 2.1 g/dL — ABNORMAL LOW (ref 3.5–5.0)
Alkaline Phosphatase: 123 U/L (ref 38–126)
Anion gap: 8 (ref 5–15)
BUN: 8 mg/dL (ref 6–20)
CO2: 20 mmol/L — ABNORMAL LOW (ref 22–32)
Calcium: 7.6 mg/dL — ABNORMAL LOW (ref 8.9–10.3)
Chloride: 107 mmol/L (ref 98–111)
Creatinine, Ser: 0.63 mg/dL (ref 0.44–1.00)
GFR, Estimated: >60 mL/min (ref >60)
Glucose, Bld: 85 mg/dL (ref 70–99)
Potassium: 3.4 mmol/L — ABNORMAL LOW (ref 3.5–5.1)
Sodium: 135 mmol/L (ref 135–145)
Total Bilirubin: 0.5 mg/dL (ref 0.3–1.2)
Total Protein: 5.4 g/dL — ABNORMAL LOW (ref 6.5–8.1)

## 2020-08-05 LAB — RPR: RPR Ser Ql: NONREACTIVE

## 2020-08-05 MED ORDER — ACETAMINOPHEN 500 MG PO TABS
1000.0000 mg | ORAL_TABLET | Freq: Four times a day (QID) | ORAL | Status: DC | PRN
  Administered 2020-08-05: 1000 mg via ORAL
  Filled 2020-08-05: qty 2

## 2020-08-05 MED ORDER — OXYTOCIN-SODIUM CHLORIDE 30-0.9 UT/500ML-% IV SOLN: 1.0000 m[IU]/min | INTRAVENOUS | Status: DC

## 2020-08-05 MED ORDER — FENTANYL CITRATE (PF) 100 MCG/2ML IJ SOLN
50.0000 ug | Freq: Once | INTRAMUSCULAR | Status: AC
Start: 2020-08-05 — End: 2020-08-05
  Administered 2020-08-05: 50 ug via INTRAVENOUS

## 2020-08-05 MED ORDER — MISOPROSTOL 50MCG HALF TABLET
50.0000 ug | ORAL_TABLET | ORAL | Status: AC | PRN
Start: 2020-08-05 — End: 2020-08-06
  Administered 2020-08-05 – 2020-08-06 (×4): 50 ug via BUCCAL
  Filled 2020-08-05 (×4): qty 1

## 2020-08-05 MED ORDER — FENTANYL CITRATE (PF) 100 MCG/2ML IJ SOLN
INTRAMUSCULAR | Status: AC
  Filled 2020-08-05: qty 2

## 2020-08-05 MED ORDER — OXYTOCIN-SODIUM CHLORIDE 30-0.9 UT/500ML-% IV SOLN
1.0000 m[IU]/min | INTRAVENOUS | Status: DC
  Administered 2020-08-05: 2 m[IU]/min via INTRAVENOUS
  Filled 2020-08-05: qty 500

## 2020-08-05 NOTE — Progress Notes (Signed)
Labor Progress Note Suriah S Sanzone is a 30 y.o. G1P0000 at 17w4dpresented for IOL cHTN SI PreE w/ SF S:  Patient is feeling good. She denies vision changes, HA, chest pain, SOB or RUQ pain  O:  BP (!) 151/102   Pulse 99   Temp 98.2 F (36.8 C) (Oral)   Resp 16   Ht 5' 6" (1.676 m)   Wt 95 kg   LMP 11/29/2019 (Exact Date)   SpO2 100%   BMI 33.80 kg/m  EFM: 135bpm/moderate variability/+accels, no decels  Toco: not tracing  CVE: Dilation: 1.5 Effacement (%): 70 Cervical Position: Posterior Station: -2 Presentation: Vertex Exam by:: FManus GunningC., cnmf   A&P: 30y.o. G1P0000 382w4dOL cHTN SI PreE w/ SF on magnesium #Labor: S/p Cytotec x 4, foley bulb in place #Pain: PRN #FWB: Cat 1 #GBS  Unknown - sent culture #cHTN SI PreE w/SF: Met criteria with multiple severe blood pressures. On magnesium and PRN labetolol and hydralazine for severe range blood pressures. Has had multiple severe range pressures and received labetolol x 4. Increased Procardia to 6072mID and has nbeen mainly normotensive to OOG since  [name] 10:02 AM

## 2020-08-05 NOTE — Progress Notes (Signed)
Cx 1.5/70/-2/  Cooks catheter inserted and inflated w/80cc H20.  FHR Cat 1  Mild ctx. Will start pitocin when foley falls out

## 2020-08-05 NOTE — Progress Notes (Signed)
Jeffery S Offner is a 30 y.o. G1P0000 at [redacted]w[redacted]d   Subjective: Resting comfortably in bed. Denies headache, visual disturbances, RUQ/epigastric pain  Objective: BP (!) 144/92   Pulse 99   Temp 98.4 F (36.9 C) (Oral)   Resp 16   Ht 5\' 6"  (1.676 m)   Wt 95 kg   LMP 11/29/2019 (Exact Date)   SpO2 100%   BMI 33.80 kg/m  I/O last 3 completed shifts: In: 7280.7 [P.O.:3586; I.V.:3144.7; IV Piggyback:550] Out: 3800 [Urine:3800] Total I/O In: 84 [I.V.:84] Out: -   FHT:  FHR: 135 bpm, variability: moderate,  accelerations:  Present,  decelerations:  Absent UC:   irregular, every 2-4 minutes SVE:   Dilation: 2 Effacement (%): 70 Station: -2 Exam by:: Dr. [email or web address]  Labs: Lab Results  Component Value Date   WBC 14.0 (H) 08/05/2020   HGB 10.7 (L) 08/05/2020   HCT 31.9 (L) 08/05/2020   MCV 85.1 08/05/2020   PLT 346 08/05/2020    Assessment / Plan:  #Chronic HTN with SIP PEC -- Magnesium Sulfate infusing, no signs of toxicity --No severe range BPs or severe symptoms --Procardia XL 60 mg BID ordered and due at 2200  #Labor --S/p Cytotec x 6 --Foley balloon placed 08/04 at 2230 hours, remains in place --Pitocin infusing at 4 milliunits --Will keep Pitocin below 8 milliunits until foley is dislodged, then titrate PRN --Cat I tracing --GBS neg  Anticipate Vaginal Delivery  10/04, CNM 08/05/2020, 2045

## 2020-08-05 NOTE — Progress Notes (Signed)
Labor Progress Note Veronica Castillo is a 30 y.o. G1P0000 at 39w4dpresented for IOL cHTN SI PreE w/ SF S:  Patient is feeling good and not feeling contractions. She denies vision changes, HA, chest pain, SOB or RUQ pain  O:  BP (!) 144/95   Pulse (!) 106   Temp 98.6 F (37 C) (Oral)   Resp 18   Ht 5' 6"  (1.676 m)   Wt 95 kg   LMP 11/29/2019 (Exact Date)   SpO2 100%   BMI 33.80 kg/m  EFM: 135bpm/moderate variability/+accels, no decels  Toco: q2  CVE: Dilation: 2 Effacement (%): 70 Cervical Position: Posterior Station: -2 Presentation: Vertex Exam by:: Dr. KMurlean Caller  A&P: 30y.o. G1P0000 378w4dOL cHTN SI PreE w/ SF on magnesium #Labor: S/p Cytotec x 6, foley bulb in place. Now 2cm with foley not moving, will start pitocin 2x2 up to 6 units #Pain: PRN #FWB: Cat 1 #GBS  Unknown - sent culture #cHTN SI PreE w/SF: Met criteria with multiple severe blood pressures. On magnesium and PRN labetolol and hydralazine for severe range blood pressures. Has had multiple severe range pressures and received labetolol x 4. Increased Procardia to 607mID and has nbeen mainly normotensive to OOG since  [name] 3:50 PM

## 2020-08-06 LAB — MAGNESIUM: Magnesium: 6.4 mg/dL (ref 1.7–2.4)

## 2020-08-06 MED ORDER — FENTANYL CITRATE (PF) 100 MCG/2ML IJ SOLN
INTRAMUSCULAR | Status: AC
  Filled 2020-08-06: qty 2

## 2020-08-06 MED ORDER — MENTHOL 3 MG MT LOZG
1.0000 | LOZENGE | OROMUCOSAL | Status: DC | PRN
  Administered 2020-08-06: 3 mg via ORAL
  Filled 2020-08-06: qty 9

## 2020-08-06 MED ORDER — OXYTOCIN-SODIUM CHLORIDE 30-0.9 UT/500ML-% IV SOLN: 1.0000 m[IU]/min | INTRAVENOUS | Status: DC

## 2020-08-06 MED ORDER — FENTANYL CITRATE (PF) 100 MCG/2ML IJ SOLN
100.0000 ug | INTRAMUSCULAR | Status: DC | PRN
Start: 2020-08-06 — End: 2020-08-07
  Administered 2020-08-06 – 2020-08-07 (×5): 100 ug via INTRAVENOUS
  Filled 2020-08-06 (×5): qty 2

## 2020-08-06 NOTE — Progress Notes (Signed)
Veronica Castillo MRN: [id number]  Subjective: -Care assumed of 30 y.o. G1P0 at [redacted]w[redacted]d who presents for IOL s/t cHTN with SIPE w/ SF. Pregnancy and medical history significant for CHTN, Long Term Aspirin Use, MJ Usage.  In room to meet acquaintance of patient and family.  Patient resting in bed.  Reports no pain or discomfort.  Endorses occasional fetal movement. Denies HA, visual disturbances, or RUQ pain.    Objective: BP (!) 149/90   Pulse 93   Temp 98.2 F (36.8 C) (Oral)   Resp 17   Ht 5\' 6"  (1.676 m)   Wt 95 kg   LMP 11/29/2019 (Exact Date)   SpO2 100%   BMI 33.80 kg/m  I/O last 3 completed shifts: In: 8012.1 [P.O.:2687; I.V.:3803.5; IV Piggyback:1521.7] Out: 2675 [Urine:2675] No intake/output data recorded.  Fetal Monitoring: FHT: 135 bpm, Min to 2676, -Decels, -Accels UC: Q4-5 min, MVUs 90-164mmHg    Vaginal Exam: SVE:   Dilation: 5 Effacement (%): 90 Station: -1 Exam by:: H Hogan CNM Membranes:AROM x 6 hrs Internal Monitors: IUPC in place  Augmentation/Induction: Pitocin:47mUn/min Cytotec: S/P 8 Doses  Assessment:  IUP at 35.6 weeks Cat I FT  SIPE w/ SF MgSO4 Infusing IOL GBS Negative  Plan: -Discussed titration of pitocin. -Reviewed potential for fetal intolerance and low, but increased risk for c/s if this was to occur. -Patient verbalizes understanding. -Informed of availability of pain medication if needed. -Next dose of Procardia due at 2200. -Continue other mgmt as ordered   11m, CNM Advanced Practice Provider, Center for [facility] 08/06/2020, 8:31 PM  Addendum 10:36 PM  -Review of chart shows Negative GBS culture and PCR. PCN discontinued.

## 2020-08-06 NOTE — Progress Notes (Signed)
Labor Progress Note Veronica Castillo is a 30 y.o. G1P0000 at 33w6dpresented for IOL cHTN SI PreE w/ SF S:  Feeling okay, she is tired of how long the induction is taking but is otherwise okay. She denies headaches, blurry vision, chest pain or RUQ pain  O:  BP (!) 154/95 (BP Location: Right Arm)   Pulse 100   Temp 97.9 F (36.6 C) (Oral)   Resp 16   Ht 5' 6"  (1.676 m)   Wt 95 kg   LMP 11/29/2019 (Exact Date)   SpO2 100%   BMI 33.80 kg/m  EFM: 125bpm/moderate variability/no accels, no decels Toco: not tracing  CVE: Dilation: 4 Effacement (%): 50 Cervical Position: Middle Station: -1 Presentation: Vertex Exam by:: JLenise Herald RN/Z.BKalman Shan RN   A&P: 30y.o. G1P0000 365w6dOL cHTN SI PreE w/ SF on magnesium #Labor: s/p cytotec x6 with foley bulb and started on pitocin 8/5 which was stopped @ 6am today for recurrent lates. Still minimal change on exam so placed cytotec @ 0822 #Pain: PRN #FWB: Cat 1, no lates since pitocin stopped, no accels but still reassuring #GBS  Unknown - sent culture #cHTN SI PreE w/SF: Met criteria with multiple severe blood pressures. On magnesium and PRN labetolol and hydralazine for severe range blood pressures. Had multiple severe range pressures and received labetolol x 4 8/4. On Procardia to 606mID and has been mainly normotensive to OOG since  [name] 9:53 AM

## 2020-08-06 NOTE — Progress Notes (Deleted)
Labor status reviewed with RN. Pt with pain score 2/10. Pitocin infusing, Cat I  Clayton Bibles, MSN, CNM Certified Nurse Midwife, [employer] for [employer], [facility] 08/06/20 12:05 AM

## 2020-08-06 NOTE — Progress Notes (Signed)
Labor Progress Note Harmoney AASHA DINA is a 30 y.o. G1P0000 at 85w6dpresented for IOL cHTN SI PreE w/ SF S:   She denies headaches, blurry vision, chest pain or RUQ pain. Feeling comfortable  O:  BP (!) 147/93 (BP Location: Right Arm)   Pulse 93   Temp 98.3 F (36.8 C) (Oral)   Resp 17   Ht 5' 6"  (1.676 m)   Wt 95 kg   LMP 11/29/2019 (Exact Date)   SpO2 100%   BMI 33.80 kg/m  EFM: 125bpm/moderate variability/+ accels, no decels Toco: not tracing/irregular  CVE: Dilation: 4.5 Effacement (%): 50, 60 Cervical Position: Middle Station: -1 Presentation: Vertex Exam by:: Dr KThornell Mule  A&P: 30y.o. G1P0000 377w6dOL cHTN SI PreE w/ SF on magnesium #Labor: s/p cytotec x6 with foley bulb and started on pitocin 8/5 which was stopped @ 6am today for recurrent lates. Now s/p 2 additional cytotecs, last @ [id number] clr fluid 1445. Once 4 hrs after last cytotec will do pitocin #Pain: PRN #FWB: Cat 1,  #GBS  Unknown - sent culture #cHTN SI PreE w/SF: Met criteria with multiple severe blood pressures. On magnesium and PRN labetolol and hydralazine for severe range blood pressures. Had multiple severe range pressures and received labetolol x 4 8/4. On Procardia to 6031mID and has been mainly normotensive to OOG since  [name] 2:42 PM

## 2020-08-06 NOTE — Progress Notes (Signed)
Delia S Towery is a 30 y.o. G1P0000 at [redacted]w[redacted]d   Subjective: Sleeping during contractions  Objective: BP (!) 152/96   Pulse 99   Temp 98.3 F (36.8 C) (Oral)   Resp 17   Ht 5\' 6"  (1.676 m)   Wt 95 kg   LMP 11/29/2019 (Exact Date)   SpO2 100%   BMI 33.80 kg/m  I/O last 3 completed shifts: In: 7280.7 [P.O.:3586; I.V.:3144.7; IV Piggyback:550] Out: 3800 [Urine:3800] Total I/O In: 993.5 [I.V.:971.9; IV Piggyback:21.7] Out: 500 [Urine:500]  FHT:  FHR: 130 bpm, variability: moderate,  accelerations:  Present,  decelerations:  Absent UC:   irregular, every 3-5 minutes SVE:   Dilation: 3 Effacement (%): 50 Station: -2 Exam by:: Sam W, CNM  Labs: Lab Results  Component Value Date   WBC 14.0 (H) 08/05/2020   HGB 10.7 (L) 08/05/2020   HCT 31.9 (L) 08/05/2020   MCV 85.1 08/05/2020   PLT 346 08/05/2020    Assessment / Plan: --Recurrent late decelerations not responsive to fluid bolus --Discussed with Dr. 10/05/2020. Will d/c Pitocin, achieve 45-60 min Cat I tracing, then place Cytotec --No evidence of Mag toxicity --No severe range BPs or other severe symptoms --Anticipate vaginal delivery  Donavan Foil, CNM 08/06/2020, 6:15 AM

## 2020-08-06 NOTE — Progress Notes (Signed)
Veronica Castillo is a 30 y.o. G1P0000 at [redacted]w[redacted]d   Subjective: Sleeping when CNM entered room.  Objective: BP 128/83   Pulse 97   Temp 98.3 F (36.8 C) (Oral)   Resp 17   Ht 5\' 6"  (1.676 m)   Wt 95 kg   LMP 11/29/2019 (Exact Date)   SpO2 100%   BMI 33.80 kg/m  I/O last 3 completed shifts: In: 7280.7 [P.O.:3586; I.V.:3144.7; IV Piggyback:550] Out: 3800 [Urine:3800] Total I/O In: 993.5 [I.V.:971.9; IV Piggyback:21.7] Out: 500 [Urine:500]  FHT:  FHR: 140 bpm, variability: moderate,  accelerations:  Present (Scalp Stim),  decelerations:  Absent UC:   irregular, every 2-4 minutes SVE:   Dilation: 3 Effacement (%): 50 Station: -2 Exam by:: Sam W, CNM  Labs: Lab Results  Component Value Date   WBC 14.0 (H) 08/05/2020   HGB 10.7 (L) 08/05/2020   HCT 31.9 (L) 08/05/2020   MCV 85.1 08/05/2020   PLT 346 08/05/2020    Assessment / Plan:  Labor --Reassuring fetal surveillance --Foley bulb dislodged at 0230 --Now 3-3.5/50% --Pitocin infusing at 4 milliunits, begin titration  CHTN w/ SIP --No severe symptoms or severe range BPs --Magnesium Sulfate infusing, no signs of toxicity  12-08-1986, CNM 08/06/2020, 2:40 AM

## 2020-08-06 NOTE — Progress Notes (Addendum)
Veronica Castillo is a 30 y.o. G1P0000 at [redacted]w[redacted]d   Subjective: Resting in left lateral. Previous "mild" HA resolved with 1,000 mg Tylenol PO. Denies visual disturbances, RUQ pain.  Objective: BP 139/71   Pulse (!) 103   Temp 98.6 F (37 C)   Resp 18   Ht 5\' 6"  (1.676 m)   Wt 95 kg   LMP 11/29/2019 (Exact Date)   SpO2 100%   BMI 33.80 kg/m  I/O last 3 completed shifts: In: 7280.7 [P.O.:3586; I.V.:3144.7; IV Piggyback:550] Out: 3800 [Urine:3800] Total I/O In: 589.1 [I.V.:567.4; IV Piggyback:21.7] Out: 500 [Urine:500]  FHT:  Baseline 140, mod var, + accels, brief run of late decels which resolved with repositioning and 500 mL bolus UC:   irregular, every 2-4 minutes SVE:   Dilation: 2 Effacement (%): 70 Station: -2 Exam by:: Dr. [email or web address]  Labs: Lab Results  Component Value Date   WBC 14.0 (H) 08/05/2020   HGB 10.7 (L) 08/05/2020   HCT 31.9 (L) 08/05/2020   MCV 85.1 08/05/2020   PLT 346 08/05/2020    Assessment / Plan:  Labor --Cat I tracing --GBS Neg --Pitocin infusing at 4 milliunits, continue titration up to 8 milliunits until foley is dislodged --Foley remains in place (25 hours 30 min since placement)  CHTN with SIP --Magnesium Sulfate infusing --No signs of toxicity --No severe symptoms or severe range BP  10/05/2020, CNM 08/06/2020, 12:06 AM

## 2020-08-06 NOTE — Progress Notes (Signed)
Veronica Castillo is a 30 y.o. G1P0000 at [redacted]w[redacted]d  admitted for induction of labor due to Pre-eclamptic toxemia of pregnancy..  Subjective:  Patient is feeling some contractions. She is now 4 hours post her last dose of cytotec.   Objective: BP 135/75 (BP Location: Right Arm)   Pulse 90   Temp 98 F (36.7 C) (Oral)   Resp 17   Ht 5\' 6"  (1.676 m)   Wt 95 kg   LMP 11/29/2019 (Exact Date)   SpO2 100%   BMI 33.80 kg/m  I/O last 3 completed shifts: In: 6902.3 [P.O.:2764; I.V.:3816.6; IV Piggyback:321.7] Out: 3700 [Urine:3700] Total I/O In: 3729.5 [P.O.:910; I.V.:1419.5; IV Piggyback:1400] Out: 525 [Urine:525]  FHT:  FHR: 130 bpm, variability: moderate,  accelerations:  Present,  decelerations:  Absent UC:   not tracing well with toco  SVE:   Dilation: 5 Effacement (%): 90 Station: -1 Exam by:: H Kalis Friese CNM  IUPC placed to better monitor contractions  Labs: Lab Results  Component Value Date   WBC 14.0 (H) 08/05/2020   HGB 10.7 (L) 08/05/2020   HCT 31.9 (L) 08/05/2020   MCV 85.1 08/05/2020   PLT 346 08/05/2020    Assessment / Plan: IOL, now 4 hours post last dose of cytotec, and has had AROM IUPC placed, will start pitocin   Labor: Progressing normally Preeclampsia:  on magnesium sulfate Fetal Wellbeing:  Category I Pain Control:  Labor support without medications and IV pain meds I/D:  n/a Anticipated MOD:  NSVD   10/05/2020 DNP, CNM  08/06/20  4:50 PM

## 2020-08-07 ENCOUNTER — Encounter (HOSPITAL_COMMUNITY)
Admission: AD | Disposition: A | Discharge status: Home / Self Care | Payer: Self-pay | Source: Home / Self Care | Attending: Obstetrics & Gynecology

## 2020-08-07 ENCOUNTER — Inpatient Hospital Stay (HOSPITAL_COMMUNITY): Payer: BC Managed Care – PPO | Admitting: Anesthesiology

## 2020-08-07 ENCOUNTER — Encounter (HOSPITAL_COMMUNITY): Payer: Self-pay | Admitting: Obstetrics & Gynecology

## 2020-08-07 DIAGNOSIS — O99210 Obesity complicating pregnancy, unspecified trimester: Secondary | ICD-10-CM | POA: Diagnosis present

## 2020-08-07 DIAGNOSIS — O114 Pre-existing hypertension with pre-eclampsia, complicating childbirth: Secondary | ICD-10-CM

## 2020-08-07 DIAGNOSIS — Z3A36 36 weeks gestation of pregnancy: Secondary | ICD-10-CM

## 2020-08-07 DIAGNOSIS — Z98891 History of uterine scar from previous surgery: Secondary | ICD-10-CM

## 2020-08-07 LAB — CBC
HCT: 35.3 % — ABNORMAL LOW (ref 36.0–46.0)
Hemoglobin: 11.8 g/dL — ABNORMAL LOW (ref 12.0–15.0)
MCH: 28.6 pg (ref 26.0–34.0)
MCHC: 33.4 g/dL (ref 30.0–36.0)
MCV: 85.5 fL (ref 80.0–100.0)
Platelets: 398 10*3/uL (ref 150–400)
RBC: 4.13 MIL/uL (ref 3.87–5.11)
RDW: 13.7 % (ref 11.5–15.5)
WBC: 19 10*3/uL — ABNORMAL HIGH (ref 4.0–10.5)
nRBC: 0 % (ref 0.0–0.2)

## 2020-08-07 SURGERY — Surgical Case
Anesthesia: Spinal | Wound class: Clean Contaminated

## 2020-08-07 MED ORDER — ONDANSETRON HCL 4 MG/2ML IJ SOLN: 4.0000 mg | Freq: Three times a day (TID) | INTRAMUSCULAR | Status: DC | PRN

## 2020-08-07 MED ORDER — ENOXAPARIN SODIUM 40 MG/0.4ML IJ SOSY
40.0000 mg | PREFILLED_SYRINGE | INTRAMUSCULAR | Status: DC
  Administered 2020-08-07 – 2020-08-09 (×3): 40 mg via SUBCUTANEOUS
  Filled 2020-08-07 (×3): qty 0.4

## 2020-08-07 MED ORDER — MORPHINE SULFATE (PF) 0.5 MG/ML IJ SOLN
INTRAMUSCULAR | Status: DC | PRN
  Administered 2020-08-07: .15 mg via INTRATHECAL

## 2020-08-07 MED ORDER — DEXAMETHASONE SODIUM PHOSPHATE 4 MG/ML IJ SOLN
INTRAMUSCULAR | Status: AC
  Filled 2020-08-07: qty 1

## 2020-08-07 MED ORDER — ACETAMINOPHEN 500 MG PO TABS
1000.0000 mg | ORAL_TABLET | Freq: Four times a day (QID) | ORAL | Status: AC
  Administered 2020-08-07 (×4): 1000 mg via ORAL
  Filled 2020-08-07 (×4): qty 2

## 2020-08-07 MED ORDER — OXYCODONE-ACETAMINOPHEN 5-325 MG PO TABS
2.0000 | ORAL_TABLET | ORAL | Status: DC | PRN
  Administered 2020-08-08 – 2020-08-10 (×8): 2 via ORAL
  Filled 2020-08-07 (×8): qty 2

## 2020-08-07 MED ORDER — KETOROLAC TROMETHAMINE 30 MG/ML IJ SOLN
INTRAMUSCULAR | Status: AC
  Filled 2020-08-07: qty 1

## 2020-08-07 MED ORDER — MEASLES, MUMPS & RUBELLA VAC IJ SOLR
0.5000 mL | Freq: Once | INTRAMUSCULAR | Status: AC
  Administered 2020-08-10: 0.5 mL via SUBCUTANEOUS
  Filled 2020-08-07: qty 0.5

## 2020-08-07 MED ORDER — SODIUM CHLORIDE 0.9 % IV SOLN
INTRAVENOUS | Status: AC
  Filled 2020-08-07: qty 500

## 2020-08-07 MED ORDER — SIMETHICONE 80 MG PO CHEW: 80.0000 mg | CHEWABLE_TABLET | ORAL | Status: DC | PRN

## 2020-08-07 MED ORDER — PHENYLEPHRINE HCL (PRESSORS) 10 MG/ML IV SOLN
INTRAVENOUS | Status: DC | PRN
  Administered 2020-08-07: 80 ug via INTRAVENOUS
  Administered 2020-08-07: 120 ug via INTRAVENOUS
  Administered 2020-08-07 (×2): 80 ug via INTRAVENOUS
  Administered 2020-08-07: 120 ug via INTRAVENOUS

## 2020-08-07 MED ORDER — HYDROMORPHONE HCL 1 MG/ML IJ SOLN: 0.2500 mg | INTRAMUSCULAR | Status: DC | PRN

## 2020-08-07 MED ORDER — KETOROLAC TROMETHAMINE 30 MG/ML IJ SOLN: 30.0000 mg | Freq: Four times a day (QID) | INTRAMUSCULAR | Status: DC | PRN

## 2020-08-07 MED ORDER — ACETAMINOPHEN 500 MG PO TABS: 1000.0000 mg | ORAL_TABLET | Freq: Four times a day (QID) | ORAL | Status: DC

## 2020-08-07 MED ORDER — NALBUPHINE HCL 10 MG/ML IJ SOLN: 5.0000 mg | INTRAMUSCULAR | Status: DC | PRN

## 2020-08-07 MED ORDER — DIBUCAINE (PERIANAL) 1 % EX OINT: 1.0000 "application " | TOPICAL_OINTMENT | CUTANEOUS | Status: DC | PRN

## 2020-08-07 MED ORDER — NALOXONE HCL 0.4 MG/ML IJ SOLN: 0.4000 mg | INTRAMUSCULAR | Status: DC | PRN

## 2020-08-07 MED ORDER — TETANUS-DIPHTH-ACELL PERTUSSIS 5-2.5-18.5 LF-MCG/0.5 IM SUSY: 0.5000 mL | PREFILLED_SYRINGE | Freq: Once | INTRAMUSCULAR | Status: DC

## 2020-08-07 MED ORDER — COCONUT OIL OIL
1.0000 "application " | TOPICAL_OIL | Status: DC | PRN
  Administered 2020-08-07: 1 via TOPICAL

## 2020-08-07 MED ORDER — CEFAZOLIN SODIUM-DEXTROSE 2-3 GM-%(50ML) IV SOLR
INTRAVENOUS | Status: DC | PRN
  Administered 2020-08-07: 2 g via INTRAVENOUS

## 2020-08-07 MED ORDER — PHENYLEPHRINE HCL-NACL 20-0.9 MG/250ML-% IV SOLN
INTRAVENOUS | Status: DC | PRN
  Administered 2020-08-07: 60 ug/min via INTRAVENOUS

## 2020-08-07 MED ORDER — SENNOSIDES-DOCUSATE SODIUM 8.6-50 MG PO TABS
2.0000 | ORAL_TABLET | Freq: Every day | ORAL | Status: DC
  Administered 2020-08-07 – 2020-08-10 (×4): 2 via ORAL
  Filled 2020-08-07 (×3): qty 2

## 2020-08-07 MED ORDER — SODIUM CHLORIDE 0.9% FLUSH: 3.0000 mL | INTRAVENOUS | Status: DC | PRN

## 2020-08-07 MED ORDER — ONDANSETRON HCL 4 MG/2ML IJ SOLN
INTRAMUSCULAR | Status: DC | PRN
  Administered 2020-08-07: 4 mg via INTRAVENOUS

## 2020-08-07 MED ORDER — SODIUM CHLORIDE 0.9 % IV SOLN
INTRAVENOUS | Status: DC | PRN
  Administered 2020-08-07: 500 mg via INTRAVENOUS

## 2020-08-07 MED ORDER — FENTANYL CITRATE (PF) 100 MCG/2ML IJ SOLN
INTRAMUSCULAR | Status: DC | PRN
  Administered 2020-08-07: 15 ug via INTRATHECAL

## 2020-08-07 MED ORDER — FERROUS SULFATE 325 (65 FE) MG PO TABS
325.0000 mg | ORAL_TABLET | ORAL | Status: DC
  Administered 2020-08-07 – 2020-08-10 (×3): 325 mg via ORAL
  Filled 2020-08-07 (×3): qty 1

## 2020-08-07 MED ORDER — BUPIVACAINE IN DEXTROSE 0.75-8.25 % IT SOLN
INTRATHECAL | Status: DC | PRN
  Administered 2020-08-07: 1.5 mL via INTRATHECAL

## 2020-08-07 MED ORDER — OXYTOCIN-SODIUM CHLORIDE 30-0.9 UT/500ML-% IV SOLN
INTRAVENOUS | Status: DC | PRN
  Administered 2020-08-07: 30 [IU] via INTRAVENOUS

## 2020-08-07 MED ORDER — GABAPENTIN 300 MG PO CAPS
300.0000 mg | ORAL_CAPSULE | Freq: Two times a day (BID) | ORAL | Status: DC
  Administered 2020-08-07 – 2020-08-10 (×7): 300 mg via ORAL
  Filled 2020-08-07 (×7): qty 1

## 2020-08-07 MED ORDER — FENTANYL CITRATE (PF) 100 MCG/2ML IJ SOLN
INTRAMUSCULAR | Status: AC
  Filled 2020-08-07: qty 2

## 2020-08-07 MED ORDER — MAGNESIUM SULFATE 40 GM/1000ML IV SOLN
2.0000 g/h | INTRAVENOUS | Status: DC
  Administered 2020-08-07: 2 g/h via INTRAVENOUS
  Filled 2020-08-07: qty 1000

## 2020-08-07 MED ORDER — LACTATED RINGERS IV SOLN: INTRAVENOUS | Status: DC

## 2020-08-07 MED ORDER — LACTATED RINGERS AMNIOINFUSION: INTRAVENOUS | Status: DC

## 2020-08-07 MED ORDER — SCOPOLAMINE 1 MG/3DAYS TD PT72
MEDICATED_PATCH | TRANSDERMAL | Status: AC
  Filled 2020-08-07: qty 1

## 2020-08-07 MED ORDER — MAGNESIUM HYDROXIDE 400 MG/5ML PO SUSP: 30.0000 mL | ORAL | Status: DC | PRN

## 2020-08-07 MED ORDER — MEPERIDINE HCL 25 MG/ML IJ SOLN: 6.2500 mg | INTRAMUSCULAR | Status: DC | PRN

## 2020-08-07 MED ORDER — SODIUM CHLORIDE 0.9 % IV SOLN: INTRAVENOUS | Status: DC | PRN

## 2020-08-07 MED ORDER — PHENYLEPHRINE HCL-NACL 20-0.9 MG/250ML-% IV SOLN
INTRAVENOUS | Status: AC
  Filled 2020-08-07: qty 250

## 2020-08-07 MED ORDER — NALOXONE HCL 4 MG/10ML IJ SOLN
1.0000 ug/kg/h | INTRAVENOUS | Status: DC | PRN
  Filled 2020-08-07: qty 5

## 2020-08-07 MED ORDER — PROMETHAZINE HCL 25 MG/ML IJ SOLN: 6.2500 mg | INTRAMUSCULAR | Status: DC | PRN

## 2020-08-07 MED ORDER — DEXAMETHASONE SODIUM PHOSPHATE 4 MG/ML IJ SOLN
INTRAMUSCULAR | Status: DC | PRN
  Administered 2020-08-07: 4 mg via INTRAVENOUS

## 2020-08-07 MED ORDER — KETOROLAC TROMETHAMINE 30 MG/ML IJ SOLN
30.0000 mg | Freq: Once | INTRAMUSCULAR | Status: AC | PRN
  Administered 2020-08-07: 30 mg via INTRAVENOUS

## 2020-08-07 MED ORDER — OXYTOCIN-SODIUM CHLORIDE 30-0.9 UT/500ML-% IV SOLN: 2.5000 [IU]/h | INTRAVENOUS | Status: DC

## 2020-08-07 MED ORDER — MORPHINE SULFATE (PF) 0.5 MG/ML IJ SOLN
INTRAMUSCULAR | Status: AC
  Filled 2020-08-07: qty 10

## 2020-08-07 MED ORDER — DIPHENHYDRAMINE HCL 25 MG PO CAPS: 25.0000 mg | ORAL_CAPSULE | ORAL | Status: DC | PRN

## 2020-08-07 MED ORDER — ZOLPIDEM TARTRATE 5 MG PO TABS: 5.0000 mg | ORAL_TABLET | Freq: Every evening | ORAL | Status: DC | PRN

## 2020-08-07 MED ORDER — OXYCODONE HCL 5 MG PO TABS
5.0000 mg | ORAL_TABLET | ORAL | Status: DC | PRN
  Administered 2020-08-08 (×2): 5 mg via ORAL
  Filled 2020-08-07 (×2): qty 1

## 2020-08-07 MED ORDER — SODIUM CHLORIDE 0.9 % IR SOLN
Status: DC | PRN
  Administered 2020-08-07: 1000 mL

## 2020-08-07 MED ORDER — IBUPROFEN 600 MG PO TABS
600.0000 mg | ORAL_TABLET | Freq: Four times a day (QID) | ORAL | Status: DC
  Administered 2020-08-08 – 2020-08-10 (×9): 600 mg via ORAL
  Filled 2020-08-07 (×9): qty 1

## 2020-08-07 MED ORDER — KETOROLAC TROMETHAMINE 30 MG/ML IJ SOLN
30.0000 mg | Freq: Four times a day (QID) | INTRAMUSCULAR | Status: AC
  Administered 2020-08-07 (×3): 30 mg via INTRAVENOUS
  Filled 2020-08-07 (×3): qty 1

## 2020-08-07 MED ORDER — OXYTOCIN-SODIUM CHLORIDE 30-0.9 UT/500ML-% IV SOLN
INTRAVENOUS | Status: AC
  Filled 2020-08-07: qty 500

## 2020-08-07 MED ORDER — WITCH HAZEL-GLYCERIN EX PADS: 1.0000 "application " | MEDICATED_PAD | CUTANEOUS | Status: DC | PRN

## 2020-08-07 MED ORDER — HYDROMORPHONE HCL 1 MG/ML IJ SOLN: 1.0000 mg | INTRAMUSCULAR | Status: DC | PRN

## 2020-08-07 MED ORDER — NALBUPHINE HCL 10 MG/ML IJ SOLN: 5.0000 mg | Freq: Once | INTRAMUSCULAR | Status: DC | PRN

## 2020-08-07 MED ORDER — PHENYLEPHRINE 40 MCG/ML (10ML) SYRINGE FOR IV PUSH (FOR BLOOD PRESSURE SUPPORT)
PREFILLED_SYRINGE | INTRAVENOUS | Status: AC
  Filled 2020-08-07: qty 10

## 2020-08-07 MED ORDER — PRENATAL MULTIVITAMIN CH
1.0000 | ORAL_TABLET | Freq: Every day | ORAL | Status: DC
  Administered 2020-08-07 – 2020-08-10 (×4): 1 via ORAL
  Filled 2020-08-07 (×4): qty 1

## 2020-08-07 MED ORDER — SCOPOLAMINE 1 MG/3DAYS TD PT72
1.0000 | MEDICATED_PATCH | Freq: Once | TRANSDERMAL | Status: AC
  Administered 2020-08-07: 1.5 mg via TRANSDERMAL

## 2020-08-07 MED ORDER — MENTHOL 3 MG MT LOZG: 1.0000 | LOZENGE | OROMUCOSAL | Status: DC | PRN

## 2020-08-07 MED ORDER — ONDANSETRON HCL 4 MG/2ML IJ SOLN
INTRAMUSCULAR | Status: AC
  Filled 2020-08-07: qty 2

## 2020-08-07 MED ORDER — DIPHENHYDRAMINE HCL 50 MG/ML IJ SOLN: 12.5000 mg | INTRAMUSCULAR | Status: DC | PRN

## 2020-08-07 MED ORDER — DIPHENHYDRAMINE HCL 25 MG PO CAPS: 25.0000 mg | ORAL_CAPSULE | Freq: Four times a day (QID) | ORAL | Status: DC | PRN

## 2020-08-07 MED ORDER — NALBUPHINE HCL 10 MG/ML IJ SOLN
5.0000 mg | INTRAMUSCULAR | Status: DC | PRN
Start: 2020-08-07 — End: 2020-08-10

## 2020-08-07 SURGICAL SUPPLY — 33 items
CHLORAPREP W/TINT 26ML (MISCELLANEOUS) ×2 IMPLANT
CLAMP CORD UMBIL (MISCELLANEOUS) IMPLANT
CLOTH BEACON ORANGE TIMEOUT ST (SAFETY) ×2 IMPLANT
DRSG OPSITE POSTOP 4X10 (GAUZE/BANDAGES/DRESSINGS) ×2 IMPLANT
ELECT REM PT RETURN 9FT ADLT (ELECTROSURGICAL) ×2
ELECTRODE REM PT RTRN 9FT ADLT (ELECTROSURGICAL) ×1 IMPLANT
EXTRACTOR VACUUM M CUP 4 TUBE (SUCTIONS) IMPLANT
GLOVE BIOGEL PI IND STRL 7.0 (GLOVE) ×3 IMPLANT
GLOVE BIOGEL PI INDICATOR 7.0 (GLOVE) ×3
GLOVE ECLIPSE 7.0 STRL STRAW (GLOVE) ×2 IMPLANT
GOWN STRL REUS W/TWL LRG LVL3 (GOWN DISPOSABLE) ×4 IMPLANT
KIT ABG SYR 3ML LUER SLIP (SYRINGE) IMPLANT
NDL HYPO 25X5/8 SAFETYGLIDE (NEEDLE) ×1 IMPLANT
NEEDLE HYPO 22GX1.5 SAFETY (NEEDLE) ×2 IMPLANT
NEEDLE HYPO 25X5/8 SAFETYGLIDE (NEEDLE) ×2 IMPLANT
NS IRRIG 1000ML POUR BTL (IV SOLUTION) ×2 IMPLANT
PACK C SECTION WH (CUSTOM PROCEDURE TRAY) ×2 IMPLANT
PAD ABD 7.5X8 STRL (GAUZE/BANDAGES/DRESSINGS) ×2 IMPLANT
PAD ABD 8X10 STRL (GAUZE/BANDAGES/DRESSINGS) ×1 IMPLANT
PAD OB MATERNITY 4.3X12.25 (PERSONAL CARE ITEMS) ×2 IMPLANT
PENCIL SMOKE EVAC W/HOLSTER (ELECTROSURGICAL) ×2 IMPLANT
RTRCTR C-SECT PINK 25CM LRG (MISCELLANEOUS) IMPLANT
SPONGE GAUZE 4X4 12PLY STER LF (GAUZE/BANDAGES/DRESSINGS) ×2 IMPLANT
SUT PDS AB 0 CTX 36 PDP370T (SUTURE) IMPLANT
SUT PLAIN 2 0 XLH (SUTURE) IMPLANT
SUT VIC AB 0 CTX 36 (SUTURE) ×4
SUT VIC AB 0 CTX36XBRD ANBCTRL (SUTURE) ×2 IMPLANT
SUT VIC AB 4-0 KS 27 (SUTURE) ×2 IMPLANT
SYR CONTROL 10ML LL (SYRINGE) ×2 IMPLANT
TAPE CLOTH SURG 4X10 WHT LF (GAUZE/BANDAGES/DRESSINGS) ×1 IMPLANT
TOWEL OR 17X24 6PK STRL BLUE (TOWEL DISPOSABLE) ×2 IMPLANT
TRAY FOLEY W/BAG SLVR 14FR LF (SET/KITS/TRAYS/PACK) ×2 IMPLANT
WATER STERILE IRR 1000ML POUR (IV SOLUTION) ×2 IMPLANT

## 2020-08-07 NOTE — Progress Notes (Signed)
Veronica Castillo MRN: [id number]  Subjective: -Patient resting in bed.  Reports some discomfort with contractions, but coping well. SO and mother remains at bedside.   Objective: BP (!) 141/94   Pulse 96   Temp 98.7 F (37.1 C)   Resp 17   Ht 5\' 6"  (1.676 m)   Wt 95 kg   LMP 11/29/2019 (Exact Date)   SpO2 100%   BMI 33.80 kg/m  I/O last 3 completed shifts: In: 8012.1 [P.O.:2687; I.V.:3803.5; IV Piggyback:1521.7] Out: 2675 [Urine:2675] Total I/O In: 403.3 [P.O.:100; I.V.:303.3] Out: 550 [Urine:550]  Fetal Monitoring: FHT: 150 bpm, Mod Var, -Decels, -Accels UC: Q2-15min, MVUs at 1m    Vaginal Exam: SVE:   Dilation: 5 Effacement (%): 90 Station: -1 Exam by:: [email or web address], CNM Membranes:AROM x 10hrs Internal Monitors: IUPC  Augmentation/Induction: Pitocin:92mUn/min Cytotec: S/P  Assessment:  IUP at 36 weeks Cat I FT  IOL  Plan: -Patient initially with recurrent lates, but resolves with position change.  -Cervical exam performed and findings discussed. -Nurse instructed to perform position changes as infant feels asynclitic.  -Will continue to monitor.  -Continue other mgmt as ordered   4m, CNM Advanced Practice Provider, Center for [facility] 08/07/2020, 12:34 AM  Addendum 2:35 AM Cat II FT  -Amnioinfusion started.  -Dr. 10/07/2020 consulted and reviews strip. -Nurse to provider and states patient requesting C/S. -Dr. Macon Large notified and provider to bedside. -Patient confirms desire for C/S and declines epidural. -Dr. Macon Large at bedside and discuss r/b of c/s.  R/B reviewed including, but not limited to, infection, bleeding, pain, damage to organs or fetus resulting in need for additional surgery.  Patient understands and accepts these risks and wishes to proceed with c/s.  Macon Large MSN, CNM Advanced Practice Provider, Center for [facility]

## 2020-08-07 NOTE — Anesthesia Preprocedure Evaluation (Signed)
Anesthesia Evaluation  Patient identified by MRN, date of birth, ID band Patient awake    Reviewed: Allergy & Precautions, H&P , NPO status , Patient's Chart, lab work & pertinent test results  Airway Mallampati: II  TM Distance: >3 FB Neck ROM: full    Dental no notable dental hx.    Pulmonary neg pulmonary ROS,    Pulmonary exam normal breath sounds clear to auscultation       Cardiovascular hypertension, Pt. on medications negative cardio ROS Normal cardiovascular exam Rhythm:regular Rate:Normal     Neuro/Psych negative neurological ROS  negative psych ROS   GI/Hepatic negative GI ROS, Neg liver ROS, GERD  Medicated,  Endo/Other  negative endocrine ROS  Renal/GU negative Renal ROS  negative genitourinary   Musculoskeletal negative musculoskeletal ROS (+)   Abdominal (+) + obese,   Peds  Hematology  (+) anemia ,   Anesthesia Other Findings   Reproductive/Obstetrics (+) Pregnancy                             Anesthesia Physical Anesthesia Plan  ASA: 3  Anesthesia Plan: Spinal   Post-op Pain Management:    Induction:   PONV Risk Score and Plan: 3 and Ondansetron, Dexamethasone and Scopolamine patch - Pre-op  Airway Management Planned: Natural Airway, Simple Face Mask and Nasal Cannula  Additional Equipment: None  Intra-op Plan:   Post-operative Plan:   Informed Consent: I have reviewed the patients History and Physical, chart, labs and discussed the procedure including the risks, benefits and alternatives for the proposed anesthesia with the patient or authorized representative who has indicated his/her understanding and acceptance.       Plan Discussed with: CRNA  Anesthesia Plan Comments:         Anesthesia Quick Evaluation

## 2020-08-07 NOTE — Anesthesia Postprocedure Evaluation (Signed)
Anesthesia Post Note  Patient: Veronica Castillo  Procedure(s) Performed: CESAREAN SECTION     Patient location during evaluation: PACU Anesthesia Type: Spinal Level of consciousness: awake and alert Pain management: pain level controlled Vital Signs Assessment: post-procedure vital signs reviewed and stable Respiratory status: spontaneous breathing, nonlabored ventilation and respiratory function stable Cardiovascular status: blood pressure returned to baseline and stable Postop Assessment: no apparent nausea or vomiting Anesthetic complications: no   No notable events documented.  Last Vitals:  Vitals:   08/07/20 0600 08/07/20 0706  BP: 123/70 128/79  Pulse: 95 90  Resp: 18   Temp: 36.6 C   SpO2:  98%    Last Pain:  Vitals:   08/07/20 0600  TempSrc: Oral  PainSc: 0-No pain   Pain Goal:                   Lowella Curb

## 2020-08-07 NOTE — Op Note (Addendum)
Veronica Castillo PROCEDURE DATE: 08/07/2020  PREOPERATIVE DIAGNOSES: Intrauterine pregnancy at [redacted]w[redacted]d weeks gestation; non-reassuring fetal status with recurrent late fetal heart rate decelerations; prolonged induction of labor for chronic hypertension with superimposed severe preeclampsia  POSTOPERATIVE DIAGNOSES: The same  PROCEDURE: Low Transverse Cesarean Section  SURGEON:  Dr. Jaynie Collins  ANESTHESIOLOGY TEAM: Anesthesiologist: Leilani Able, MD CRNA: Armanda Heritage, CRNA  INDICATIONS: Veronica Castillo is a 30 y.o. G1P0 at [redacted]w[redacted]d here for cesarean section secondary to the indications listed under preoperative diagnoses; please see preoperative note for further details.  The risks of surgery were discussed with the patient including but were not limited to: bleeding which may require transfusion or reoperation; infection which may require antibiotics; injury to bowel, bladder, ureters or other surrounding organs; injury to the fetus; need for additional procedures including hysterectomy in the event of a life-threatening hemorrhage; formation of adhesions; placental abnormalities wth subsequent pregnancies; incisional problems; thromboembolic phenomenon and other postoperative/anesthesia complications.  The patient concurred with the proposed plan, giving informed written consent for the procedure.    FINDINGS:  Viable female infant in cephalic, direct occiput presentation.  Apgars 8 and 9.  Clear amniotic fluid.  Intact placenta, three vessel cord.  Normal uterus and fallopian tubes. Enlarged left ovary noted, normal right ovary.  ANESTHESIA: Spinal ESTIMATED BLOOD LOSS: 450 ml SPECIMENS: Placenta sent to pathology COMPLICATIONS: None immediate  PROCEDURE IN DETAIL:  The patient preoperatively received intravenous antibiotics and had sequential compression devices applied to her lower extremities.  She was then taken to the operating room where spinal anesthesia was administered  and was found to be adequate. She was then placed in a dorsal supine position with a leftward tilt, and prepped and draped in a sterile manner.  A foley catheter was placed into her bladder and attached to constant gravity.  After an adequate timeout was performed, a Pfannenstiel skin incision was made with scalpel and carried through to the underlying layer of fascia. The fascia was incised in the midline, and this incision was extended bilaterally using the Mayo scissors.  Kocher clamps were applied to the superior aspect of the fascial incision and the underlying rectus muscles were dissected off bluntly and sharply.  A similar process was carried out on the inferior aspect of the fascial incision. The rectus muscles were separated in the midline and the peritoneum was entered bluntly. The Alexis self-retaining retractor was introduced into the abdominal cavity.  Attention was turned to the lower uterine segment where a low transverse hysterotomy was made with a scalpel and extended bilaterally bluntly.  The infant was successfully delivered, the cord was clamped and cut after one minute, and the infant was handed over to the awaiting neonatology team. Uterine massage was then administered, and the placenta delivered intact with a three-vessel cord. The uterus was then cleared of clots and debris.  The hysterotomy was closed with 0 Vicryl in a running locked fashion, and an imbricating layer was also placed with 0 Vicryl.   The pelvis was cleared of all clot and debris. Hemostasis was confirmed on all surfaces.  The retractor was removed.  The peritoneum was closed with a 0 Vicryl running stitch and the rectus muscles were reapproximated using 0 Vicryl interrupted stitches. The fascia was then closed using 0 PDS in a running fashion.  The subcutaneous layer was irrigated, reapproximated with 2-0 plain gut interrupted stitches, and the skin was closed with a 4-0 Vicryl subcuticular stitch. The patient tolerated  the procedure  well. Sponge, instrument and needle counts were correct x 3.  She was taken to the recovery room in stable condition.    Jaynie Collins, MD, FACOG Obstetrician & Gynecologist, [facility] for [employer], [facility]

## 2020-08-07 NOTE — Transfer of Care (Signed)
Immediate Anesthesia Transfer of Care Note  Patient: Veronica Castillo  Procedure(s) Performed: CESAREAN SECTION  Patient Location: PACU  Anesthesia Type:Spinal  Level of Consciousness: awake, alert  and oriented  Airway & Oxygen Therapy: Patient Spontanous Breathing  Post-op Assessment: Report given to RN and Post -op Vital signs reviewed and stable  Post vital signs: Reviewed and stable  Last Vitals:  Vitals Value Taken Time  BP 90/55 08/07/20 0436  Temp    Pulse 82 08/07/20 0443  Resp 13 08/07/20 0443  SpO2 94 % 08/07/20 0443  Vitals shown include unvalidated device data.  Last Pain:  Vitals:   08/07/20 0054  TempSrc:   PainSc: 5          Complications: No notable events documented.

## 2020-08-07 NOTE — Discharge Summary (Signed)
Postpartum Discharge Summary      Patient Name: Veronica Castillo DOB: [birth year] MRN: [id number]  Date of admission: 08/04/2020 Delivery date:08/07/2020  Delivering provider: Verita Schneiders A  Date of discharge: 08/10/2020  Admitting diagnosis: Preeclampsia, severe [O14.10] Intrauterine pregnancy: [redacted]w[redacted]d    Secondary diagnosis:  Principal Problem:   Severe preeclampsia, delivered Active Problems:   Chronic hypertension with superimposed severe preeclampsia   Preeclampsia, severe   Obesity complicating pregnancy   S/P cesarean section   Postoperative anemia due to acute blood loss  Additional problems: None   Discharge diagnosis: Preeclampsia (severe) and CHTN with superimposed preeclampsia                                              Post partum procedures: Magnesium sulfate for eclampsia prophylaxis Augmentation: N/A Complications: None  Hospital course: Induction of Labor With Cesarean Section   30y.o. yo G1P0000 at 376w0das admitted to the hospital 08/04/2020 [facility]. Patient had a labor course significant for cytotec dosing x 8, AROM, and Pitocin.  She was also on MgSO4 for cHTN with SIPE w/SF. The patient went for cesarean section due to Non-Reassuring FHR. Delivery details are as follows: Membrane Rupture Time/Date: 2:35 PM ,08/06/2020   Delivery Method:C-Section, Low Transverse  Details of operation can be found in separate operative note.  Patient received intrapartum and postpartum magnesium sulfate for eclampsia prophylaxis as per protocol. BP control was obtained with Procardia XL 90 mg po bid and HCTZ daily, there were no further immediate complications.  Otherwise, patient had a routine postpartum course. Patient is discharged home in stable condition on 08/10/2020, and will follow up in the office for BP check in less than one week.  Newborn Data: Birth date:08/07/2020  Birth time:3:48 AM  Gender:Female  Living  status:Living  Apgars:6 ,8  Weight:2340 g                                Magnesium Sulfate received: Yes: Seizure prophylaxis BMZ received: No Rhophylac:N/A MMKWI:OXBDZHGo be given prior to discharge, RN aware T-DaP:Given prenatally Flu: N/A Transfusion:No  Physical exam  Vitals:   08/10/20 1005 08/10/20 1138 08/10/20 1340 08/10/20 1355  BP:  (!) 145/81 (!) 149/82 134/82  Pulse:  (!) 119 (!) 108 (!) 106  Resp:   18   Temp:   98.5 F (36.9 C)   TempSrc:   Oral   SpO2: 98%  99%   Weight:      Height:       General: alert, cooperative, and no distress Lochia: appropriate Uterine Fundus: firm Incision: healing well, no significant drainage, no dehiscence, no significant erythema, honeycomb dressing with small amount of old blood staining on the right side DVT Evaluation: No evidence of DVT seen on physical exam. No cords or calf tenderness. Calf/Ankle edema is present.  Labs: Lab Results  Component Value Date   WBC 14.5 (H) 08/08/2020   HGB 8.6 (L) 08/08/2020   HCT 25.8 (L) 08/08/2020   MCV 86.3 08/08/2020   PLT 347 08/08/2020   CMP Latest Ref Rng & Units 08/08/2020  Glucose 70 - 99 mg/dL 86  BUN 6 - 20 mg/dL 11  Creatinine 0.44 - 1.00 mg/dL 0.71  Sodium 135 - 145 mmol/L  133(L)  Potassium 3.5 - 5.1 mmol/L 4.1  Chloride 98 - 111 mmol/L 104  CO2 22 - 32 mmol/L 22  Calcium 8.9 - 10.3 mg/dL 6.1(LL)  Total Protein 6.5 - 8.1 g/dL 4.7(L)  Total Bilirubin 0.3 - 1.2 mg/dL 0.5  Alkaline Phos 38 - 126 U/L 106  AST 15 - 41 U/L 32  ALT 0 - 44 U/L 17   Edinburgh Score: Edinburgh Postnatal Depression Scale Screening Tool 08/07/2020  I have been able to laugh and see the funny side of things. 0  I have looked forward with enjoyment to things. 0  I have blamed myself unnecessarily when things went wrong. 0  I have been anxious or worried for no good reason. 0  I have felt scared or panicky for no good reason. 0  Things have been getting on top of me. 0  I have been so unhappy  that I have had difficulty sleeping. 0  I have felt sad or miserable. 0  I have been so unhappy that I have been crying. 0  The thought of harming myself has occurred to me. 0  Edinburgh Postnatal Depression Scale Total 0     After visit meds:  Allergies as of 08/10/2020   No Known Allergies      Medication List     STOP taking these medications    amoxicillin 875 MG tablet Commonly known as: AMOXIL   aspirin EC 81 MG tablet   pantoprazole 40 MG tablet Commonly known as: Protonix       TAKE these medications    FeroSul 325 (65 FE) MG tablet Generic drug: ferrous sulfate Take 1 tablet (325 mg total) by mouth every other day. Start taking on: August 12, 2020   hydrochlorothiazide 25 MG tablet Commonly known as: HYDRODIURIL Take 1 tablet (25 mg total) by mouth daily. Start taking on: August 11, 2020   ibuprofen 600 MG tablet Commonly known as: ADVIL Take 1 tablet (600 mg total) by mouth every 6 (six) hours as needed for mild pain or moderate pain.   NIFEdipine 90 MG 24 hr tablet Commonly known as: ADALAT CC Take 1 tablet (90 mg total) by mouth 2 (two) times daily. What changed:  medication strength how much to take when to take this   oxyCODONE 5 MG immediate release tablet Commonly known as: Oxy IR/ROXICODONE Take 1 tablet (5 mg total) by mouth every 4 (four) hours as needed for severe pain or breakthrough pain.   prenatal multivitamin Tabs tablet Take 1 tablet by mouth daily at 12 noon.   Senexon-S 8.6-50 MG tablet Generic drug: senna-docusate Take 2 tablets by mouth at bedtime as needed for mild constipation or moderate constipation.               Durable Medical Equipment  (From admission, onward)           Start     Ordered   08/07/20 0902  For home use only DME double electric breast pump  Once        08/07/20 0901   08/07/20 0853  For home use only DME double electric breast pump  Once        08/07/20 0853               Discharge Care Instructions  (From admission, onward)           Start     Ordered   08/10/20 0000  Discharge wound care:  Comments: As per discharge handout and nursing instructions   08/10/20 1318             Discharge home in stable condition Infant Feeding: Breast Infant Disposition:NICU Discharge instruction: per After Visit Summary and Postpartum booklet. Activity: Advance as tolerated. Pelvic rest for 6 weeks.  Diet: routine diet Future Appointments: Future Appointments  Date Time Provider [place]  08/16/2020 10:00 AM [facility] NURSE [facility]  09/12/2020  3:10 PM Eshaal Duby, Sallyanne Havers, MD [facility]   Follow up Visit: Message sent to Susan Moore on 08/07/2020   Please schedule this patient for a In person postpartum visit in 4 weeks with the following provider: Any provider. Additional Postpartum F/U:Incision check 1 week and BP Check. High risk pregnancy complicated by: GDM Delivery mode:  C-Section, Low Transverse  Anticipated Birth Control:  POPs   08/10/2020 Verita Schneiders, MD

## 2020-08-07 NOTE — Anesthesia Procedure Notes (Signed)
Spinal  Patient location during procedure: OR Start time: 08/07/2020 3:25 AM End time: 08/07/2020 3:27 AM Reason for block: surgical anesthesia Staffing Performed: anesthesiologist  Anesthesiologist: Leilani Able, MD Preanesthetic Checklist Completed: patient identified, IV checked, site marked, risks and benefits discussed, surgical consent, monitors and equipment checked, pre-op evaluation and timeout performed Spinal Block Patient position: sitting Prep: DuraPrep and site prepped and draped Patient monitoring: continuous pulse ox and blood pressure Approach: midline Location: L3-4 Injection technique: single-shot Needle Needle type: Pencan  Needle gauge: 24 G Needle length: 10 cm Needle insertion depth: 6 cm Assessment Sensory level: T4 Events: CSF return

## 2020-08-07 NOTE — Lactation Note (Signed)
This note was copied from a baby's chart. Lactation Consultation Note  Patient Name: Boy Jaeda Momon WGNFA'O Date: 08/07/2020 Reason for consult: Initial assessment;1st time breastfeeding;Late-preterm 34-36.6wks;Infant < 6lbs Age:28 hours  P1, Mother MgS04.  Baby 36 weeks.  Assisted with latching and baby latching with intermittent swallows for 25 min.  FOB will supplement with 5-10 ml of formula after. Stork pump iniated. DEBP setup.  Follow LPI feeding plan. Mom made aware of O/P services, breastfeeding support groups, community resources, and our phone # for post-discharge questions.   Maternal Data Has patient been taught Hand Expression?: Yes Does the patient have breastfeeding experience prior to this delivery?: No  Feeding Mother's Current Feeding Choice: Breast Milk and Formula  LATCH Score Latch: Repeated attempts needed to sustain latch, nipple held in mouth throughout feeding, stimulation needed to elicit sucking reflex.  Audible Swallowing: Spontaneous and intermittent  Type of Nipple: Everted at rest and after stimulation  Comfort (Breast/Nipple): Soft / non-tender  Hold (Positioning): Assistance needed to correctly position infant at breast and maintain latch.  LATCH Score: 8   Lactation Tools Discussed/Used Tools: Pump  Interventions Interventions: Breast feeding basics reviewed;Assisted with latch;Skin to skin;Hand express;Adjust position;Support pillows;DEBP;Education  Discharge Pump: Stork Pump WIC Program: Yes (and [employer])  Consult Status Consult Status: Follow-up Date: 08/08/20 Follow-up type: In-patient    [facility] 08/07/2020, 9:51 AM

## 2020-08-07 NOTE — Progress Notes (Signed)
CSW received consult for hx of marijuana use.  Referral was screened out due to the following: ~MOB had no documented substance use after initial prenatal visit/+UPT. ~MOB had no positive drug screens after initial prenatal visit/+UPT.  Please consult CSW if current concerns arise or by MOB's request.  CSW will monitor CDS results and make report to Child Protective Services if warranted.  Rocio Wolak, LCSW Clinical Social Worker [facility] Cell#: [phone] 

## 2020-08-07 NOTE — Progress Notes (Signed)
Patient with recurrent late FHR decelerations for over 30 minutes, not responsive to intrauterine resuscitative efforts.  Recommended cesarean section, patient agrees with this plan.    The risks of surgery were discussed with the patient including but were not limited to: bleeding which may require transfusion or reoperation; infection which may require antibiotics; injury to bowel, bladder, ureters or other surrounding organs; injury to the fetus; need for additional procedures including hysterectomy in the event of a life-threatening hemorrhage; formation of adhesions; placental abnormalities with subsequent pregnancies; incisional problems; thromboembolic phenomenon and other postoperative/anesthesia complications.  The patient concurred with the proposed plan, giving informed written consent for the procedure.   Anesthesia and OR aware. Preoperative prophylactic antibiotics and SCDs ordered on call to the OR.  To OR when ready.   Jaynie Collins, MD, FACOG Obstetrician & Gynecologist, [facility] for [employer], [facility]

## 2020-08-08 ENCOUNTER — Encounter (HOSPITAL_COMMUNITY): Payer: Self-pay | Admitting: Obstetrics & Gynecology

## 2020-08-08 DIAGNOSIS — D62 Acute posthemorrhagic anemia: Secondary | ICD-10-CM

## 2020-08-08 HISTORY — DX: Acute posthemorrhagic anemia: D62

## 2020-08-08 LAB — COMPREHENSIVE METABOLIC PANEL
ALT: 17 U/L (ref 0–44)
AST: 32 U/L (ref 15–41)
Albumin: 1.6 g/dL — ABNORMAL LOW (ref 3.5–5.0)
Alkaline Phosphatase: 106 U/L (ref 38–126)
Anion gap: 7 (ref 5–15)
BUN: 11 mg/dL (ref 6–20)
CO2: 22 mmol/L (ref 22–32)
Calcium: 6.1 mg/dL — CL (ref 8.9–10.3)
Chloride: 104 mmol/L (ref 98–111)
Creatinine, Ser: 0.71 mg/dL (ref 0.44–1.00)
GFR, Estimated: >60 mL/min (ref >60)
Glucose, Bld: 86 mg/dL (ref 70–99)
Potassium: 4.1 mmol/L (ref 3.5–5.1)
Sodium: 133 mmol/L — ABNORMAL LOW (ref 135–145)
Total Bilirubin: 0.5 mg/dL (ref 0.3–1.2)
Total Protein: 4.7 g/dL — ABNORMAL LOW (ref 6.5–8.1)

## 2020-08-08 LAB — CBC
HCT: 25.8 % — ABNORMAL LOW (ref 36.0–46.0)
Hemoglobin: 8.6 g/dL — ABNORMAL LOW (ref 12.0–15.0)
MCH: 28.8 pg (ref 26.0–34.0)
MCHC: 33.3 g/dL (ref 30.0–36.0)
MCV: 86.3 fL (ref 80.0–100.0)
Platelets: 347 10*3/uL (ref 150–400)
RBC: 2.99 MIL/uL — ABNORMAL LOW (ref 3.87–5.11)
RDW: 13.6 % (ref 11.5–15.5)
WBC: 14.5 10*3/uL — ABNORMAL HIGH (ref 4.0–10.5)
nRBC: 0 % (ref 0.0–0.2)

## 2020-08-08 LAB — RUBELLA ANTIBODY, IGM: Rubella IgM: <20 AU/mL (ref 0.0–19.9)

## 2020-08-08 MED ORDER — SODIUM CHLORIDE 0.9 % IV SOLN
500.0000 mg | Freq: Once | INTRAVENOUS | Status: AC
  Administered 2020-08-09: 500 mg via INTRAVENOUS
  Filled 2020-08-08: qty 25

## 2020-08-08 NOTE — Plan of Care (Signed)
  Problem: Education: Goal: Knowledge of General Education information will improve Description: Including pain rating scale, medication(s)/side effects and non-pharmacologic comfort measures Outcome: Progressing   Problem: Health Behavior/Discharge Planning: Goal: Ability to manage health-related needs will improve Outcome: Progressing   Problem: Clinical Measurements: Goal: Ability to maintain clinical measurements within normal limits will improve Outcome: Progressing Goal: Will remain free from infection Outcome: Progressing Goal: Diagnostic test results will improve Outcome: Progressing Goal: Respiratory complications will improve Outcome: Progressing Goal: Cardiovascular complication will be avoided Outcome: Progressing   Problem: Activity: Goal: Risk for activity intolerance will decrease Outcome: Progressing   Problem: Nutrition: Goal: Adequate nutrition will be maintained Outcome: Progressing   Problem: Coping: Goal: Level of anxiety will decrease Outcome: Progressing   Problem: Elimination: Goal: Will not experience complications related to bowel motility Outcome: Progressing Goal: Will not experience complications related to urinary retention Outcome: Progressing   Problem: Pain Managment: Goal: General experience of comfort will improve Outcome: Progressing   Problem: Safety: Goal: Ability to remain free from injury will improve Outcome: Progressing   Problem: Skin Integrity: Goal: Risk for impaired skin integrity will decrease Outcome: Progressing   Problem: Education: Goal: Knowledge of disease or condition will improve Outcome: Progressing Goal: Knowledge of the prescribed therapeutic regimen will improve Outcome: Progressing   Problem: Fluid Volume: Goal: Peripheral tissue perfusion will improve Outcome: Progressing   Problem: Clinical Measurements: Goal: Complications related to disease process, condition or treatment will be avoided or  minimized Outcome: Progressing   Problem: Education: Goal: Knowledge of condition will improve Outcome: Progressing Goal: Individualized Educational Video(s) Outcome: Progressing Goal: Individualized Newborn Educational Video(s) Outcome: Progressing   Problem: Activity: Goal: Will verbalize the importance of balancing activity with adequate rest periods Outcome: Progressing Goal: Ability to tolerate increased activity will improve Outcome: Progressing   Problem: Coping: Goal: Ability to identify and utilize available resources and services will improve Outcome: Progressing   Problem: Life Cycle: Goal: Chance of risk for complications during the postpartum period will decrease Outcome: Progressing   Problem: Role Relationship: Goal: Ability to demonstrate positive interaction with newborn will improve Outcome: Progressing   Problem: Skin Integrity: Goal: Demonstration of wound healing without infection will improve Outcome: Progressing   Problem: Education: Goal: Knowledge of condition will improve Outcome: Progressing Goal: Individualized Educational Video(s) Outcome: Progressing Goal: Individualized Newborn Educational Video(s) Outcome: Progressing   Problem: Activity: Goal: Will verbalize the importance of balancing activity with adequate rest periods Outcome: Progressing Goal: Ability to tolerate increased activity will improve Outcome: Progressing   Problem: Coping: Goal: Ability to identify and utilize available resources and services will improve Outcome: Progressing   Problem: Life Cycle: Goal: Chance of risk for complications during the postpartum period will decrease Outcome: Progressing   Problem: Role Relationship: Goal: Ability to demonstrate positive interaction with newborn will improve Outcome: Progressing   Problem: Skin Integrity: Goal: Demonstration of wound healing without infection will improve Outcome: Progressing

## 2020-08-08 NOTE — Progress Notes (Signed)
Daily Postpartum Note  Admission Date: 08/04/2020 Current Date: 08/08/2020 7:40 AM  Veronica Castillo is a 30 y.o. G1P0101 POD#1 s/p pLTCS @ 46w1dfor failed IOL. Patient admitted for IOL for superimposed severe pre-eclampisa on cHTN  Pregnancy complicated by: Patient Active Problem List   Diagnosis Date Noted   Obesity complicating pregnancy 088/28/0034  S/P cesarean section 08/07/2020   Preeclampsia, severe 08/04/2020   Severe preeclampsia, delivered 08/01/2020   Aspirin long-term use 07/29/2020   Chronic hypertension with superimposed severe preeclampsia 03/02/2020   Positive urine drug screen 02/15/20 MJ 02/25/2020   Supervision of high risk pregnancy in third trimester 02/15/2020    Overnight/24hr events:  Pt just came off Mg  Subjective:  No s/s of pre-eclampsia. +flatus, no BM yet. Taking PO w/o issue, pain is controlled.   Objective:    Current Vital Signs 24h Vital Sign Ranges  T 98.2 F (36.8 C) Temp  Avg: 98.1 F (36.7 C)  Min: 97.8 F (36.6 C)  Max: 98.4 F (36.9 C)  BP 139/78 BP  Min: 129/87  Max: 148/87  HR 95 Pulse  Avg: 93.7  Min: 85  Max: 104  RR 16 Resp  Avg: 17.2  Min: 16  Max: 18  SaO2 98 % Room Air SpO2  Avg: 97.3 %  Min: 96 %  Max: 98 %       24 Hour I/O Current Shift I/O  Time Ins Outs 08/07 0701 - 08/08 0700 In: 3994.4 [P.O.:1440; I.V.:2554.4] Out: 3550 [Urine:3550] No intake/output data recorded.   Patient Vitals for the past 24 hrs:  BP Temp Temp src Pulse Resp SpO2  08/08/20 0423 139/78 98.2 F (36.8 C) Axillary 95 16 98 %  08/07/20 2358 (!) 143/78 -- -- 94 -- --  08/07/20 2339 (!) 148/87 98.4 F (36.9 C) Oral (!) 104 18 98 %  08/07/20 1948 136/84 98.4 F (36.9 C) Oral 94 18 --  08/07/20 1900 -- -- -- -- 17 --  08/07/20 1800 -- -- -- -- 17 --  08/07/20 1700 -- -- -- -- 16 --  08/07/20 1538 129/87 97.8 F (36.6 C) Oral 90 18 96 %  08/07/20 1400 -- -- -- -- 17 --  08/07/20 1310 -- -- -- -- 18 --  08/07/20 1155 (!) 139/93 97.8 F (36.6 C)  Oral 85 18 97 %  08/07/20 1105 -- -- -- -- 16 --  08/07/20 1000 -- -- -- -- 18 --  08/07/20 0900 -- -- -- -- 18 --  08/07/20 0800 -- -- -- -- 16 --    Physical exam: General: Well nourished, well developed female in no acute distress. Abdomen: c/d/I pressure dressing, mildly distended, rare BS Cardiovascular: S1, S2 normal, no murmur, rub or gallop, regular rate and rhythm Respiratory: CTAB Extremities: no clubbing, cyanosis or edema Skin: Warm and dry.   Medications: Current Facility-Administered Medications  Medication Dose Route Frequency Provider Last Rate Last Admin   coconut oil  1 application Topical PRN Anyanwu, USallyanne Havers MD   1 application at [id number]/79/151224   witch hazel-glycerin (TUCKS) pad 1 application  1 application Topical PRN Anyanwu, Ugonna A, MD       And   dibucaine (NUPERCAINAL) 1 % rectal ointment 1 application  1 application Rectal PRN Anyanwu, Ugonna A, MD       diphenhydrAMINE (BENADRYL) injection 12.5 mg  12.5 mg Intravenous Q4H PRN Hatchett, FMateo Flow MD       Or   diphenhydrAMINE (BENADRYL) capsule 25  mg  25 mg Oral Q4H PRN Hatchett, Mateo Flow, MD       diphenhydrAMINE (BENADRYL) capsule 25 mg  25 mg Oral Q6H PRN Anyanwu, Ugonna A, MD       enoxaparin (LOVENOX) injection 40 mg  40 mg Subcutaneous Q24H Anyanwu, Ugonna A, MD   40 mg at 08/07/20 2206   ferrous sulfate tablet 325 mg  325 mg Oral QODAY Anyanwu, Ugonna A, MD   325 mg at 08/07/20 1247   gabapentin (NEURONTIN) capsule 300 mg  300 mg Oral BID Anyanwu, Ugonna A, MD   300 mg at 08/07/20 2205   labetalol (NORMODYNE) injection 20 mg  20 mg Intravenous PRN Anyanwu, Sallyanne Havers, MD   20 mg at 08/04/20 1913   And   labetalol (NORMODYNE) injection 40 mg  40 mg Intravenous PRN Anyanwu, Ugonna A, MD       And   labetalol (NORMODYNE) injection 80 mg  80 mg Intravenous PRN Anyanwu, Ugonna A, MD       And   hydrALAZINE (APRESOLINE) injection 10 mg  10 mg Intravenous PRN Anyanwu, Ugonna A, MD       HYDROmorphone  (DILAUDID) injection 1-2 mg  1-2 mg Intravenous Q3H PRN Anyanwu, Ugonna A, MD       ibuprofen (ADVIL) tablet 600 mg  600 mg Oral Q6H Anyanwu, Ugonna A, MD   600 mg at 08/08/20 0549   lactated ringers infusion   Intravenous Continuous Aletha Halim, MD   Stopped at 08/08/20 0535   magnesium hydroxide (MILK OF MAGNESIA) suspension 30 mL  30 mL Oral Q3 days PRN Anyanwu, Sallyanne Havers, MD       measles, mumps & rubella vaccine (MMR) injection 0.5 mL  0.5 mL Subcutaneous Once Anyanwu, Ugonna A, MD       menthol-cetylpyridinium (CEPACOL) lozenge 3 mg  1 lozenge Oral Q2H PRN Anyanwu, Ugonna A, MD       nalbuphine (NUBAIN) injection 5 mg  5 mg Intravenous Q4H PRN Hatchett, Mateo Flow, MD       Or   nalbuphine (NUBAIN) injection 5 mg  5 mg Subcutaneous Q4H PRN Hatchett, Mateo Flow, MD       nalbuphine (NUBAIN) injection 5 mg  5 mg Intravenous Once PRN Lyn Hollingshead, MD       Or   nalbuphine (NUBAIN) injection 5 mg  5 mg Subcutaneous Once PRN Hatchett, Mateo Flow, MD       naloxone [facility]) injection 0.4 mg  0.4 mg Intravenous PRN Hatchett, Mateo Flow, MD       And   sodium chloride flush (NS) 0.9 % injection 3 mL  3 mL Intravenous PRN Hatchett, Mateo Flow, MD       naloxone HCl (NARCAN) 2 mg in dextrose 5 % 250 mL infusion  1-4 mcg/kg/hr Intravenous Continuous PRN Hatchett, Mateo Flow, MD       NIFEdipine (PROCARDIA XL/NIFEDICAL XL) 24 hr tablet 60 mg  60 mg Oral BID Anyanwu, Ugonna A, MD   60 mg at 08/07/20 2208   ondansetron (ZOFRAN) injection 4 mg  4 mg Intravenous Q8H PRN Hatchett, Mateo Flow, MD       oxyCODONE (Oxy IR/ROXICODONE) immediate release tablet 5-10 mg  5-10 mg Oral Q4H PRN Anyanwu, Ugonna A, MD       oxyCODONE-acetaminophen (PERCOCET/ROXICET) 5-325 MG per tablet 2 tablet  2 tablet Oral Q4H PRN Anyanwu, Ugonna A, MD       prenatal multivitamin tablet 1 tablet  1 tablet Oral Q1200 Anyanwu, Sallyanne Havers, MD  1 tablet at 08/07/20 1225   scopolamine (TRANSDERM-SCOP) 1 MG/3DAYS 1.5 mg  1 patch Transdermal  Once Lyn Hollingshead, MD   1.5 mg at 08/07/20 0445   senna-docusate (Senokot-S) tablet 2 tablet  2 tablet Oral Daily Anyanwu, Ugonna A, MD   2 tablet at 08/07/20 1227   simethicone (MYLICON) chewable tablet 80 mg  80 mg Oral PRN Anyanwu, Sallyanne Havers, MD       Tdap (BOOSTRIX) injection 0.5 mL  0.5 mL Intramuscular Once Anyanwu, Ugonna A, MD       zolpidem (AMBIEN) tablet 5 mg  5 mg Oral QHS PRN Osborne Oman, MD        Labs:  Recent Labs  Lab 08/05/20 0607 08/07/20 0253 08/08/20 0410  WBC 14.0* 19.0* 14.5*  HGB 10.7* 11.8* 8.6*  HCT 31.9* 35.3* 25.8*  PLT 346 398 347    Recent Labs  Lab 08/04/20 1050 08/05/20 0607 08/08/20 0410  NA 135 135 133*  K 3.7 3.4* 4.1  CL 107 107 104  CO2 19* 20* 22  BUN 10 8 11   CREATININE 0.63 0.63 0.71  CALCIUM 8.7* 7.6* 6.1*  PROT 6.2* 5.4* 4.7*  BILITOT 0.6 0.5 0.5  ALKPHOS 112 123 106  ALT 19 17 17   AST 30 25 32  GLUCOSE 67* 85 86     Radiology:  No new imaging  Assessment & Plan:  Pt doing well *Postpartum: B POS. Desires circ. POPs. F/u rubella screen *CV: follow BPs *PPx: lovenox qday *FEN/GI: regular diet, SLIV *Dispo: likely pod#2-3  Durene Romans MD [facility]ending [facility] GYN Consult Phone: [phone] (M-F, 0800-1700) & [phone]  (Off hours, weekends, holidays)

## 2020-08-08 NOTE — Lactation Note (Signed)
This note was copied from a baby's chart. Lactation Consultation Note  Patient Name: Veronica Castillo Date: 08/08/2020 Reason for consult: Follow-up assessment;NICU baby;Other (Comment);Infant < 6lbs;Late-preterm 34-36.6wks;1st time breastfeeding;Primapara;Infant weight loss (as LC entered the room mom informed LC baby had been transferred due to unstable blood sugars. per mom has pumped x 2 in the last 24 hours, LC offered to review and check flange , decreased to the #21 to accommodate the stimulation of the areolas.) Age:28 hours Per mom and dad baby may be transferred back to moms room if blood sugars are stable.   Maternal Data    Feeding Mother's Current Feeding Choice: Breast Milk and Formula Nipple Type: Nfant Slow Flow (purple)  LATCH Score                    Lactation Tools Discussed/Used Tools: Pump;Flanges Flange Size: 24;21;Other (comment) (started out at #24 and LC assessed switched to the #21 - fit is better - per mom comfortable.) Breast pump type: Double-Electric Breast Pump Pump Education: Setup, frequency, and cleaning Reason for Pumping: LPT , Less than 6 pounds, Post MagSo4, low blood sugars  Interventions    Discharge    Consult Status Consult Status: Follow-up Date: 08/09/20 Follow-up type: In-patient    Veronica Castillo 08/08/2020, 2:15 PM

## 2020-08-08 NOTE — Lactation Note (Signed)
This note was copied from a baby's chart. Lactation Consultation Note  Patient Name: Veronica Castillo [id number] Date: 08/08/2020 Reason for consult: Follow-up assessment;NICU baby;Primapara;1st time breastfeeding;Late-preterm 34-36.6wks;Infant weight loss;Other (Comment);Infant < 6lbs (hypoglycemia) Age:28 hours  Visited with mom of 35 hours old LPI NICU female, she's a P1 and already pumping. Reviewed pumping expectations and explained to mom that the purpose of pumping this early on is mainly for breast stimulation and not to get volume, she voiced understanding.  Reviewed benefits of premature milk for NICU babies, pumping schedule and lactogenesis II. Parents have decided to switch from formula to fortified donor milk, mom wasn't aware of the availability of donor milk in the unit, that's the only reason she chose formula.  Plan of care:  Encouraged mom to start pumping consistently, at least 8 times/24 hours Once baby is transferred to mother's room will continue current LPI plan addressed by charge [facility]; baby might be discharged from the NICU today  FOB present and supportive. Parents reported all questions and concerns were answered, they're both aware of LC services and will call PRN.  Maternal Data   Mom's milk volume is WNL, she hasn't started the onset of lactogenesis II yet  Feeding Mother's Current Feeding Choice: Breast Milk and Donor Milk  Lactation Tools Discussed/Used Tools: Pump;Flanges Flange Size: 24;21 Breast pump type: Double-Electric Breast Pump Pump Education: Setup, frequency, and cleaning;Milk Storage Reason for Pumping: LPI in NICU < 6 lbs, hypoglycemia Pumping frequency: q 3 hours (recommended) Pumped volume: 0 mL  Interventions Interventions: Breast feeding basics reviewed;DEBP;Education  Discharge Pump: Stork Pump;DEBP  Consult Status Consult Status: Follow-up Date: 08/09/20 Follow-up type: In-patient    Gayle Martinez Venetia Constable 08/08/2020, 2:54  PM

## 2020-08-08 NOTE — Progress Notes (Addendum)
Ambulation has been encouraged. Pt refuses due to pain and soreness. PRN medication has been offered but pt unwilling to take. Scheduled pain medication has been given. K pad has been applied. Will continue to monitor.

## 2020-08-08 NOTE — Lactation Note (Signed)
This note was copied from a baby's chart. Lactation Consultation Note  Patient Name: Boy Panzy Italiano Today's Date: 08/08/2020   Age:28 hours  Attempted to visit with mom but she was in the bathroom. LC to come back later for F/U assessment, GOB is in the NICU with grandchild.  Maternal Data    Feeding Nipple Type: Nfant Slow Flow (purple)  Lactation Tools Discussed/Used    Interventions    Discharge    Consult Status      Deshante Cassell Venetia Constable 08/08/2020, 1:17 PM

## 2020-08-09 LAB — SURGICAL PATHOLOGY

## 2020-08-09 MED ORDER — NIFEDIPINE ER OSMOTIC RELEASE 60 MG PO TB24
90.0000 mg | ORAL_TABLET | Freq: Two times a day (BID) | ORAL | Status: DC
  Administered 2020-08-09 – 2020-08-10 (×3): 90 mg via ORAL
  Filled 2020-08-09 (×3): qty 1

## 2020-08-09 MED ORDER — FUROSEMIDE 40 MG PO TABS
20.0000 mg | ORAL_TABLET | Freq: Once | ORAL | Status: AC
  Administered 2020-08-09: 20 mg via ORAL
  Filled 2020-08-09: qty 1

## 2020-08-09 NOTE — Progress Notes (Signed)
Post Partum Day 2 (POD2 s/p pLTCS for NRFHT in s/o CHTN with SI PreE with Severe features by BP) Subjective: no complaints, up ad lib, voiding, tolerating PO, + flatus, and + BM. She denies HA/BV/RUQ pain. She notes some incisional discomfort, but pain is well controlled with PO medications. She is spontaneously ambulating and minimal lochia.   Objective: Blood pressure (!) 149/80, pulse 86, temperature 98.2 F (36.8 C), temperature source Oral, resp. rate 16, height _0  (1.676 m), weight 95 kg, last menstrual period 11/29/2019, SpO2 97 %.  BP range: 124-161/78-90  Physical Exam:  General: alert, cooperative, and no distress Lochia: appropriate Uterine Fundus: firm Incision: healing well, no significant drainage, no dehiscence, no significant erythema, honeycomb dressing with small amount of old blood staining on the right side DVT Evaluation: No evidence of DVT seen on physical exam. No cords or calf tenderness. Calf/Ankle edema is present.  Recent Labs    08/07/20 0253 08/08/20 0410  HGB 11.8* 8.6*  HCT 35.3* 25.8*    Assessment/Plan: CHTN with SIPE (PreE with SF) - s/p Mag - Increased Procardia to 90 BID - Exam c/w edema, lasix 20 mg will also be given, d/w pt side effects - Discussed potential need for second agent   Anemia - HgB 8.6 following c-section - acute blood loss anemia.  - s/p Venofer x1 dose  Routine PP Care - POP for contraception - Pain is well controlled with PO medications - Breastfeeding - Baby boy - plan is for circ; currently in NICU  4. Rubella Non-Immune - MMR vaccine ordered    LOS: 5 days   Radene Gunning 08/09/2020, 10:39 AM

## 2020-08-09 NOTE — Plan of Care (Signed)
  Problem: Education: Goal: Knowledge of General Education information will improve Description: Including pain rating scale, medication(s)/side effects and non-pharmacologic comfort measures Outcome: Progressing   Problem: Clinical Measurements: Goal: Ability to maintain clinical measurements within normal limits will improve Outcome: Progressing Goal: Will remain free from infection Outcome: Progressing   Problem: Activity: Goal: Risk for activity intolerance will decrease Outcome: Progressing   Problem: Nutrition: Goal: Adequate nutrition will be maintained Outcome: Progressing   Problem: Coping: Goal: Level of anxiety will decrease Outcome: Progressing   Problem: Elimination: Goal: Will not experience complications related to bowel motility Outcome: Progressing   Problem: Pain Managment: Goal: General experience of comfort will improve Outcome: Progressing   Problem: Safety: Goal: Ability to remain free from injury will improve Outcome: Progressing   Problem: Education: Goal: Knowledge of disease or condition will improve Outcome: Progressing   Problem: Fluid Volume: Goal: Peripheral tissue perfusion will improve Outcome: Progressing   Problem: Clinical Measurements: Goal: Complications related to disease process, condition or treatment will be avoided or minimized Outcome: Progressing   Problem: Education: Goal: Knowledge of condition will improve Outcome: Progressing   Problem: Activity: Goal: Will verbalize the importance of balancing activity with adequate rest periods Outcome: Progressing Goal: Ability to tolerate increased activity will improve Outcome: Progressing   Problem: Life Cycle: Goal: Chance of risk for complications during the postpartum period will decrease Outcome: Progressing   Problem: Role Relationship: Goal: Ability to demonstrate positive interaction with newborn will improve Outcome: Progressing   Problem: Skin  Integrity: Goal: Demonstration of wound healing without infection will improve Outcome: Progressing   Problem: Education: Goal: Knowledge of condition will improve Outcome: Progressing   Problem: Activity: Goal: Will verbalize the importance of balancing activity with adequate rest periods Outcome: Progressing Goal: Ability to tolerate increased activity will improve Outcome: Progressing   Problem: Coping: Goal: Ability to identify and utilize available resources and services will improve Outcome: Progressing   Problem: Life Cycle: Goal: Chance of risk for complications during the postpartum period will decrease Outcome: Progressing   Problem: Role Relationship: Goal: Ability to demonstrate positive interaction with newborn will improve Outcome: Progressing   Problem: Education: Goal: Individualized Educational Video(s) Outcome: Completed/Met Goal: Individualized Newborn Educational Video(s) Outcome: Completed/Met   Problem: Education: Goal: Individualized Educational Video(s) Outcome: Completed/Met Goal: Individualized Newborn Educational Video(s) Outcome: Completed/Met

## 2020-08-09 NOTE — Lactation Note (Signed)
This note was copied from a baby's chart. Lactation Consultation Note Mother continues to pump q3 without difficulty. Volume appropriate for pp day 2.  She has received her pump through [facility] and is aware of LC services. Will plan f/u visit to further assist.   Patient Name: Boy Melady Kozma [id number] Date: 08/09/2020 Reason for consult: Follow-up assessment Age:28 hours  Maternal Data  Pumping frequency: q3 Pump: Stork Pump Feeding Mother's Current Feeding Choice: Breast Milk and Formula  Interventions Interventions: Education  Consult Status Consult Status: Follow-up Follow-up type: In-patient  Elder Negus, MA IBCLC 08/09/2020, 10:28 AM

## 2020-08-10 ENCOUNTER — Other Ambulatory Visit (HOSPITAL_COMMUNITY): Payer: Self-pay

## 2020-08-10 MED ORDER — NIFEDIPINE ER 90 MG PO TB24
90.0000 mg | ORAL_TABLET | Freq: Two times a day (BID) | ORAL | 2 refills | Status: DC
  Filled 2020-08-10: qty 60, 30d supply, fill #0

## 2020-08-10 MED ORDER — SENNOSIDES-DOCUSATE SODIUM 8.6-50 MG PO TABS
2.0000 | ORAL_TABLET | Freq: Every evening | ORAL | 1 refills | Status: DC | PRN
  Filled 2020-08-10: qty 30, 15d supply, fill #0
  Filled 2020-09-07: qty 30, 15d supply, fill #1

## 2020-08-10 MED ORDER — FERROUS SULFATE 325 (65 FE) MG PO TABS
325.0000 mg | ORAL_TABLET | ORAL | 3 refills | Status: DC
  Filled 2020-08-10: qty 30, 60d supply, fill #0

## 2020-08-10 MED ORDER — OXYCODONE HCL 5 MG PO TABS
5.0000 mg | ORAL_TABLET | ORAL | 0 refills | Status: DC | PRN
  Filled 2020-08-10: qty 30, 5d supply, fill #0

## 2020-08-10 MED ORDER — IBUPROFEN 600 MG PO TABS
600.0000 mg | ORAL_TABLET | Freq: Four times a day (QID) | ORAL | 0 refills | Status: DC | PRN
  Filled 2020-08-10: qty 30, 8d supply, fill #0

## 2020-08-10 MED ORDER — HYDROCHLOROTHIAZIDE 25 MG PO TABS
25.0000 mg | ORAL_TABLET | Freq: Every day | ORAL | Status: DC
  Administered 2020-08-10: 25 mg via ORAL
  Filled 2020-08-10: qty 1

## 2020-08-10 MED ORDER — HYDROCHLOROTHIAZIDE 25 MG PO TABS
25.0000 mg | ORAL_TABLET | Freq: Every day | ORAL | 2 refills | Status: DC
  Filled 2020-08-10: qty 30, 30d supply, fill #0

## 2020-08-10 NOTE — Progress Notes (Signed)
Pt Off unit, in nicu

## 2020-08-10 NOTE — Progress Notes (Signed)
Pt off unit, in nicu   

## 2020-08-11 ENCOUNTER — Ambulatory Visit: Payer: Self-pay

## 2020-08-11 ENCOUNTER — Other Ambulatory Visit: Payer: BC Managed Care – PPO

## 2020-08-11 ENCOUNTER — Encounter: Payer: BC Managed Care – PPO | Admitting: Obstetrics and Gynecology

## 2020-08-11 NOTE — Lactation Note (Signed)
This note was copied from a baby's chart. Lactation Consultation Note  Patient Name: Boy Reshunda Preston [id number] Date: 08/11/2020 Reason for consult: Follow-up assessment;Primapara;NICU baby;1st time breastfeeding;Late-preterm 34-36.6wks Age:28 days  I followed up with Ms. Dedrick and her 79 day old son, Garen Grams, and her support person. She reports that her breasts are full and heavy today. She last pumped yesterday. I reviewed supply and demand nature of milk production and recommended that she pump q2-3 hours or average 8 pumps a day.   She has not been latching baby, but she states that on discharge, she'd like to breast feed. We discussed how to know if baby is getting enough to eat. I recommended a follow up OP and she consented to a referral.  Discharge education conducted.   Plan: Pump 8 times a day. If breast feeding first, post pump. Offer breast as much as possible. Supplement after using provider recommended amount. Follow up with pediatrician within 48 hours of discharge. Follow up with OP lactation for a weighted feeding and further guidance with feeding plan.  Maternal Data Does the patient have breastfeeding experience prior to this delivery?: No  Feeding Mother's Current Feeding Choice: Breast Milk and Formula Nipple Type: Dr. Lorne Skeens   Lactation Tools Discussed/Used Breast pump type: Double-Electric Breast Pump;Other (comment) (Stork Pump) Pump Education: Setup, frequency, and cleaning Reason for Pumping: supplementation; support milk production Pumping frequency: inconsitent; recommended q3 Pumped volume: 0 mL (milk transitioning today)  Interventions Interventions: Breast feeding basics reviewed;Education  Discharge Discharge Education: Outpatient recommendation;Outpatient Epic message sent;Warning signs for feeding baby;Engorgement and breast care  Consult Status Consult Status: Complete Follow-up type: Out-patient    Walker Shadow 08/11/2020, 10:05  AM

## 2020-08-16 ENCOUNTER — Ambulatory Visit (INDEPENDENT_AMBULATORY_CARE_PROVIDER_SITE_OTHER): Payer: BC Managed Care – PPO | Admitting: *Deleted

## 2020-08-16 ENCOUNTER — Other Ambulatory Visit: Payer: Self-pay

## 2020-08-16 DIAGNOSIS — Z98891 History of uterine scar from previous surgery: Secondary | ICD-10-CM

## 2020-08-16 NOTE — Progress Notes (Signed)
Subjective:  Veronica Castillo is a 30 y.o. female here for BP check and incision check.  Hypertension ROS: taking medications as instructed, no medication side effects noted, no chest pain on exertion, no dyspnea on exertion, and no swelling of ankles.   Pt reports incision covered with honeycomb dressing.  Objective:  BP 125/82   Pulse 94   LMP 11/29/2019 (Exact Date)   Appearance alert, well appearing, and in no distress. Incision healing well, does have some swelling above the incision.    Assessment:   Blood Pressure today in office well controlled.  Honeycomb removed, incision healed nicely, no signs of infection, scar looks good. Dr Vergie Living looked at swelling and thinks it edema and recommends warm compresses and opt advised to call if any redness or drainage.   Plan:  Follow up as needed and or at postpartum visit. Marland Kitchen

## 2020-08-17 ENCOUNTER — Ambulatory Visit (INDEPENDENT_AMBULATORY_CARE_PROVIDER_SITE_OTHER): Payer: BC Managed Care – PPO | Admitting: *Deleted

## 2020-08-17 DIAGNOSIS — Z98891 History of uterine scar from previous surgery: Secondary | ICD-10-CM

## 2020-08-17 NOTE — Progress Notes (Signed)
ATTESTATION OF SUPERVISION OF RN: Evaluation and management procedures were performed by the RN under my supervision and collaboration. I have reviewed the nursing note and chart and agree with the management and plan for this patient.  Toria Monte, CNM  

## 2020-08-17 NOTE — Progress Notes (Signed)
Patient was assessed and managed by nursing staff during this encounter. I have reviewed the chart and agree with the documentation and plan. I have also made any necessary editorial changes. No e/o infection with normal skin color but with induration above and below the incision, no fluid weeping. Pt told to let us know any changes.   Warba Bing, MD 08/17/2020 9:19 AM

## 2020-08-17 NOTE — Progress Notes (Signed)
Subjective:     Veronica Castillo is a 30 y.o. female who presents to the clinic to recheck her incision as she had some bleeding coming from it.    Objective:    LMP 11/29/2019 (Exact Date)  General:  alert, well appearing, in no apparent distress  Incision:   no dehiscence, incision well approximated, no redness or current drainage. It possible maybe from the edema in the area of the incision.     Assessment:    Doing well postoperatively. Pt to continue to watch the area and use warm compresses and to [place] know if are gets worse.    Plan:    1. Continue any current medications. 2. Wound care discussed. 3. Follow up: as needed .   Scheryl Marten, RN

## 2020-08-18 ENCOUNTER — Encounter: Payer: BC Managed Care – PPO | Admitting: Family Medicine

## 2020-08-18 ENCOUNTER — Other Ambulatory Visit: Payer: BC Managed Care – PPO

## 2020-08-18 ENCOUNTER — Ambulatory Visit: Payer: BC Managed Care – PPO

## 2020-08-18 ENCOUNTER — Encounter: Payer: Self-pay | Admitting: Radiology

## 2020-08-19 ENCOUNTER — Telehealth (HOSPITAL_COMMUNITY): Payer: Self-pay | Admitting: *Deleted

## 2020-08-19 NOTE — Telephone Encounter (Signed)
Left message to return nurse call.  Duffy Rhody, RN 08-19-2020 at 12:23pm

## 2020-08-20 ENCOUNTER — Other Ambulatory Visit: Payer: Self-pay | Admitting: Obstetrics and Gynecology

## 2020-08-25 ENCOUNTER — Encounter: Payer: BC Managed Care – PPO | Admitting: Family Medicine

## 2020-09-01 ENCOUNTER — Encounter: Payer: BC Managed Care – PPO | Admitting: Obstetrics and Gynecology

## 2020-09-04 ENCOUNTER — Inpatient Hospital Stay (HOSPITAL_COMMUNITY): Admit: 2020-09-04 | Payer: Self-pay

## 2020-09-08 ENCOUNTER — Other Ambulatory Visit: Payer: Self-pay

## 2020-09-12 ENCOUNTER — Ambulatory Visit (INDEPENDENT_AMBULATORY_CARE_PROVIDER_SITE_OTHER): Payer: BC Managed Care – PPO | Admitting: Obstetrics & Gynecology

## 2020-09-12 ENCOUNTER — Other Ambulatory Visit: Payer: Self-pay

## 2020-09-12 ENCOUNTER — Encounter: Payer: Self-pay | Admitting: *Deleted

## 2020-09-12 VITALS — BP 117/72 | HR 120 | Wt 171.0 lb

## 2020-09-12 DIAGNOSIS — K5900 Constipation, unspecified: Secondary | ICD-10-CM | POA: Diagnosis not present

## 2020-09-12 DIAGNOSIS — O119 Pre-existing hypertension with pre-eclampsia, unspecified trimester: Secondary | ICD-10-CM | POA: Diagnosis not present

## 2020-09-12 DIAGNOSIS — I10 Essential (primary) hypertension: Secondary | ICD-10-CM | POA: Diagnosis not present

## 2020-09-12 MED ORDER — SENNOSIDES-DOCUSATE SODIUM 8.6-50 MG PO TABS: 2.0000 | ORAL_TABLET | Freq: Every evening | ORAL | 2 refills | Status: DC | PRN

## 2020-09-12 MED ORDER — NIFEDIPINE ER 90 MG PO TB24: 90.0000 mg | ORAL_TABLET | Freq: Every day | ORAL | 2 refills | Status: DC

## 2020-09-12 NOTE — Progress Notes (Signed)
Post Partum Visit Note  Veronica Castillo is a 30 y.o. G1P1  female who presents for a postpartum visit. She is 5 weeks postpartum following a Low Transverse C-Section.  I have fully reviewed the prenatal and intrapartum course, patient had CHTN with superimposed severe preeclampsia. The delivery was at 36 gestational weeks, done after prolonged IOL and non reassuring fetal heart rate tracing.  Anesthesia: spinal. Postpartum course has been unremarkable. Baby is doing well. Baby is feeding by both breast and bottle - Gerber Soothe Pro . Bleeding  she thinks cycle may be starting . Bowel function is abnormal: constipation . Bladder function is normal. Patient is not sexually active. Contraception method is none. Postpartum depression screening: negative. EPDS= 0   The pregnancy intention screening data noted above was reviewed. Potential methods of contraception were discussed. The patient elected to proceed with No data recorded.   Edinburgh Postnatal Depression Scale - 09/12/20 1526       Edinburgh Postnatal Depression Scale:  In the Past 7 Days   I have been able to laugh and see the funny side of things. 0    I have looked forward with enjoyment to things. 0    I have blamed myself unnecessarily when things went wrong. 0    I have been anxious or worried for no good reason. 0    I have felt scared or panicky for no good reason. 0    Things have been getting on top of me. 0    I have been so unhappy that I have had difficulty sleeping. 0    I have felt sad or miserable. 0    I have been so unhappy that I have been crying. 0    The thought of harming myself has occurred to me. 0    Edinburgh Postnatal Depression Scale Total 0             Health Maintenance Due  Topic Date Due   COVID-19 Vaccine (3 - Booster for Pfizer series) 12/28/2019   INFLUENZA VACCINE  Never done   The following portions of the patient's history were reviewed and updated as appropriate: allergies, current  medications, past family history, past medical history, past social history, past surgical history, and problem list.  Review of Systems Pertinent items noted in HPI and remainder of comprehensive ROS otherwise negative.  Objective:  BP 117/72   Pulse (!) 120   Wt 171 lb (77.6 kg)   Breastfeeding Yes Comment: BOTH  BMI 27.60 kg/m    General:  alert and no distress   Breasts:  normal  Lungs: clear to auscultation bilaterally  Heart:  regular rate and rhythm  Abdomen: soft, non-tender; bowel sounds normal; no masses,  no organomegaly   Wound well approximated incision, well-healed, no erythema, no induration  GU exam:  not indicated       Assessment:   Normal postpartum exam.   Plan:   Essential components of care per ACOG recommendations:  1.  Mood and well being: Patient with negative depression screening today. Reviewed local resources for support.  - Patient tobacco use? No.   - hx of drug use? No.    2. Infant care and feeding:  -Patient currently breastmilk feeding? Yes. Discussed returning to work and pumping. Reviewed importance of draining breast regularly to support lactation.  -Social determinants of health (SDOH) reviewed in EPIC. No concerns.  3. Sexuality, contraception and birth spacing - Patient does not want a pregnancy  in the next year.  Desired family size is 3 children.  - Reviewed forms of contraception in tiered fashion. Patient desired oral progesterone-only contraceptive today.  Slynd samples given today. - Discussed birth spacing of 18 months  4. Sleep and fatigue -Encouraged family/partner/community support of 4 hrs of uninterrupted sleep to help with mood and fatigue  5. Physical Recovery  - Discussed patients delivery and complications - Patient had a C-section.  - Patient has urinary incontinence? No. - Patient is safe to resume physical and sexual activity  6.  Health Maintenance - HM due items addressed Yes - Last pap smear 02/2020 at  [facility]  7. Constipation - Recommended OTC Miralax as needed - Senokot refilled  8. Chronic Disease/Pregnancy Condition follow up: Hypertension - Changed regimen to just Nifedipine 90 mg daily - Referral made to [facility]   Jaynie Collins, MD [facility], [facility]

## 2020-09-19 ENCOUNTER — Inpatient Hospital Stay (HOSPITAL_COMMUNITY)
Admission: AD | Admit: 2020-09-19 | Discharge: 2020-09-19 | Disposition: A | Discharge status: Home / Self Care | Payer: BC Managed Care – PPO | Attending: Family Medicine | Admitting: Family Medicine

## 2020-09-19 DIAGNOSIS — O99893 Other specified diseases and conditions complicating puerperium: Secondary | ICD-10-CM

## 2020-09-19 DIAGNOSIS — O9089 Other complications of the puerperium, not elsewhere classified: Secondary | ICD-10-CM | POA: Insufficient documentation

## 2020-09-19 DIAGNOSIS — N939 Abnormal uterine and vaginal bleeding, unspecified: Secondary | ICD-10-CM | POA: Diagnosis not present

## 2020-09-19 DIAGNOSIS — Z8759 Personal history of other complications of pregnancy, childbirth and the puerperium: Secondary | ICD-10-CM | POA: Insufficient documentation

## 2020-09-19 NOTE — MAU Note (Signed)
Primary c/s on 8/7. Started bleeding 9/10.   Bleeding has continued, passing some clots nickle to quarter sized.  Called the OB/GYN that she went to while she was pregnant, they felt she needed to come here.  Has not resumed sexual relations.
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
## 2020-09-27 ENCOUNTER — Ambulatory Visit: Payer: BC Managed Care – PPO | Admitting: Obstetrics and Gynecology

## 2020-12-06 ENCOUNTER — Encounter: Payer: Self-pay | Admitting: Obstetrics & Gynecology

## 2020-12-06 ENCOUNTER — Other Ambulatory Visit: Payer: Self-pay | Admitting: *Deleted

## 2020-12-06 MED ORDER — SLYND 4 MG PO TABS: 4.0000 mg | ORAL_TABLET | Freq: Every day | ORAL | 11 refills | Status: DC

## 2020-12-19 ENCOUNTER — Other Ambulatory Visit: Payer: Self-pay | Admitting: Obstetrics & Gynecology

## 2020-12-19 DIAGNOSIS — O119 Pre-existing hypertension with pre-eclampsia, unspecified trimester: Secondary | ICD-10-CM

## 2020-12-20 NOTE — Telephone Encounter (Signed)
Patient is to get further antihypertensive refills from her PCP as she is no longer pregnant.

## 2021-01-18 ENCOUNTER — Encounter: Payer: Self-pay | Admitting: Adult Health

## 2021-01-18 ENCOUNTER — Other Ambulatory Visit: Payer: Self-pay

## 2021-01-18 ENCOUNTER — Ambulatory Visit: Payer: BC Managed Care – PPO | Admitting: Adult Health

## 2021-01-18 VITALS — BP 130/88 | HR 91 | Ht 65.98 in | Wt 199.2 lb

## 2021-01-18 DIAGNOSIS — E559 Vitamin D deficiency, unspecified: Secondary | ICD-10-CM | POA: Diagnosis not present

## 2021-01-18 DIAGNOSIS — E669 Obesity, unspecified: Secondary | ICD-10-CM | POA: Insufficient documentation

## 2021-01-18 DIAGNOSIS — Z13220 Encounter for screening for lipoid disorders: Secondary | ICD-10-CM

## 2021-01-18 DIAGNOSIS — Z131 Encounter for screening for diabetes mellitus: Secondary | ICD-10-CM

## 2021-01-18 DIAGNOSIS — H60393 Other infective otitis externa, bilateral: Secondary | ICD-10-CM

## 2021-01-18 DIAGNOSIS — Z1389 Encounter for screening for other disorder: Secondary | ICD-10-CM | POA: Diagnosis not present

## 2021-01-18 DIAGNOSIS — H6090 Unspecified otitis externa, unspecified ear: Secondary | ICD-10-CM | POA: Insufficient documentation

## 2021-01-18 DIAGNOSIS — D509 Iron deficiency anemia, unspecified: Secondary | ICD-10-CM | POA: Diagnosis not present

## 2021-01-18 DIAGNOSIS — Z6832 Body mass index (BMI) 32.0-32.9, adult: Secondary | ICD-10-CM | POA: Diagnosis not present

## 2021-01-18 MED ORDER — HYDROCORTISONE-ACETIC ACID 1-2 % OT SOLN: 3.0000 [drp] | Freq: Three times a day (TID) | OTIC | 1 refills | Status: DC

## 2021-01-18 NOTE — Assessment & Plan Note (Signed)
Suspect fungal given exam appearance and intense itching, acetic acid hydrocortisone ear drops sent in to pharmacy.

## 2021-01-18 NOTE — Patient Instructions (Signed)
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
## 2021-01-26 ENCOUNTER — Encounter: Payer: Self-pay | Admitting: Adult Health

## 2021-01-26 ENCOUNTER — Other Ambulatory Visit: Payer: Self-pay

## 2021-01-26 ENCOUNTER — Other Ambulatory Visit (INDEPENDENT_AMBULATORY_CARE_PROVIDER_SITE_OTHER): Payer: BC Managed Care – PPO

## 2021-01-26 DIAGNOSIS — Z13220 Encounter for screening for lipoid disorders: Secondary | ICD-10-CM | POA: Diagnosis not present

## 2021-01-26 DIAGNOSIS — D509 Iron deficiency anemia, unspecified: Secondary | ICD-10-CM | POA: Diagnosis not present

## 2021-01-26 DIAGNOSIS — E559 Vitamin D deficiency, unspecified: Secondary | ICD-10-CM

## 2021-01-26 DIAGNOSIS — Z131 Encounter for screening for diabetes mellitus: Secondary | ICD-10-CM | POA: Diagnosis not present

## 2021-01-26 DIAGNOSIS — Z1389 Encounter for screening for other disorder: Secondary | ICD-10-CM

## 2021-01-26 LAB — COMPREHENSIVE METABOLIC PANEL
ALT: 13 U/L (ref 0–35)
AST: 14 U/L (ref 0–37)
Albumin: 4.2 g/dL (ref 3.5–5.2)
Alkaline Phosphatase: 71 U/L (ref 39–117)
BUN: 12 mg/dL (ref 6–23)
CO2: 28 mEq/L (ref 19–32)
Calcium: 9.5 mg/dL (ref 8.4–10.5)
Chloride: 105 mEq/L (ref 96–112)
Creatinine, Ser: 0.65 mg/dL (ref 0.40–1.20)
GFR: 120.1 mL/min (ref >60.00)
Glucose, Bld: 83 mg/dL (ref 70–99)
Potassium: 4.3 mEq/L (ref 3.5–5.1)
Sodium: 138 mEq/L (ref 135–145)
Total Bilirubin: 0.5 mg/dL (ref 0.2–1.2)
Total Protein: 7.4 g/dL (ref 6.0–8.3)

## 2021-01-26 LAB — CBC WITH DIFFERENTIAL/PLATELET
Basophils Absolute: 0 10*3/uL (ref 0.0–0.1)
Basophils Relative: 0.7 % (ref 0.0–3.0)
Eosinophils Absolute: 0.2 10*3/uL (ref 0.0–0.7)
Eosinophils Relative: 2.5 % (ref 0.0–5.0)
HCT: 38.5 % (ref 36.0–46.0)
Hemoglobin: 12.4 g/dL (ref 12.0–15.0)
Lymphocytes Relative: 31 % (ref 12.0–46.0)
Lymphs Abs: 2 10*3/uL (ref 0.7–4.0)
MCHC: 32.2 g/dL (ref 30.0–36.0)
MCV: 83.6 fl (ref 78.0–100.0)
Monocytes Absolute: 0.5 10*3/uL (ref 0.1–1.0)
Monocytes Relative: 7.4 % (ref 3.0–12.0)
Neutro Abs: 3.8 10*3/uL (ref 1.4–7.7)
Neutrophils Relative %: 58.4 % (ref 43.0–77.0)
Platelets: 333 10*3/uL (ref 150.0–400.0)
RBC: 4.61 Mil/uL (ref 3.87–5.11)
RDW: 14.4 % (ref 11.5–15.5)
WBC: 6.5 10*3/uL (ref 4.0–10.5)

## 2021-01-26 LAB — IBC + FERRITIN
Ferritin: 33.3 ng/mL (ref 10.0–291.0)
Iron: 111 ug/dL (ref 42–145)
Saturation Ratios: 28.6 % (ref 20.0–50.0)
TIBC: 387.8 ug/dL (ref 250.0–450.0)
Transferrin: 277 mg/dL (ref 212.0–360.0)

## 2021-01-26 LAB — LIPID PANEL
Cholesterol: 207 mg/dL — ABNORMAL HIGH (ref 0–200)
HDL: 56.7 mg/dL (ref >39.00)
LDL Cholesterol: 133 mg/dL — ABNORMAL HIGH (ref 0–99)
NonHDL: 150.61
Total CHOL/HDL Ratio: 4
Triglycerides: 88 mg/dL (ref 0.0–149.0)
VLDL: 17.6 mg/dL (ref 0.0–40.0)

## 2021-01-26 LAB — HEMOGLOBIN A1C: Hgb A1c MFr Bld: 4.9 % (ref 4.6–6.5)

## 2021-01-26 LAB — TSH: TSH: 0.69 u[IU]/mL (ref 0.35–5.50)

## 2021-01-26 LAB — VITAMIN D 25 HYDROXY (VIT D DEFICIENCY, FRACTURES): VITD: 13.14 ng/mL — ABNORMAL LOW (ref 30.00–100.00)

## 2021-01-27 ENCOUNTER — Other Ambulatory Visit: Payer: Self-pay | Admitting: Adult Health

## 2021-01-27 DIAGNOSIS — E559 Vitamin D deficiency, unspecified: Secondary | ICD-10-CM

## 2021-01-27 LAB — URINE CULTURE
MICRO NUMBER:: 12924210
SPECIMEN QUALITY:: ADEQUATE

## 2021-01-27 MED ORDER — VITAMIN D (ERGOCALCIFEROL) 1.25 MG (50000 UNIT) PO CAPS
50000.0000 [IU] | ORAL_CAPSULE | ORAL | 0 refills | Status: DC
Start: 2021-01-27 — End: 2022-03-23

## 2021-01-27 NOTE — Progress Notes (Signed)
Meds ordered this encounter  Medications   Vitamin D, Ergocalciferol, (DRISDOL) 1.25 MG (50000 UNIT) CAPS capsule    Sig: Take 1 capsule (50,000 Units total) by mouth every 7 (seven) days. (taking one tablet per week)    Dispense:  12 capsule    Refill:  0

## 2021-01-27 NOTE — Progress Notes (Signed)
Your vitamin D level is low insufficient I recommend   You may start prescription for vitamin  D 50000 units by mouth ONCE weekly for 12 weeks only. I have sent this to your pharmacy. After 12 weeks.   Total cholesterol and LDL elevated.  Discuss lifestyle modification with patient e.g. increase exercise, fiber, fruits, vegetables, lean meat, and omega 3/fish intake and decrease saturated fat.  If patient following strict diet and exercise program already please schedule follow up appointment with primary care physician   CMP and CMP is within normal limits.   Hemoglobin A1C is non diabetic. Iron levels within normal limits.  TSH is within low limits normal- recheck TSH with vitamin D in 3 months at lab.  Lipid panel recheck in 6 months.

## 2021-01-27 NOTE — Addendum Note (Signed)
Addended by: Leeanne Rio on: 01/27/2021 03:20 PM   Modules accepted: Orders

## 2021-01-30 ENCOUNTER — Telehealth: Payer: Self-pay

## 2021-01-30 NOTE — Telephone Encounter (Signed)
Pt returning call

## 2021-01-30 NOTE — Telephone Encounter (Signed)
Lvm for pt to return call in regards to her Vitamin D

## 2021-01-30 NOTE — Telephone Encounter (Signed)
Lvm for pt to return call in regards to urine culture, results came back negative no indication for treatment at this time.

## 2021-01-31 ENCOUNTER — Telehealth: Payer: Self-pay

## 2021-01-31 NOTE — Telephone Encounter (Signed)
Called [email or web address]  and cancelled Vit D per Flinchum's request.

## 2021-02-02 ENCOUNTER — Encounter: Payer: Self-pay | Admitting: Adult Health

## 2021-04-18 ENCOUNTER — Encounter: Payer: BC Managed Care – PPO | Admitting: Adult Health

## 2021-11-01 IMAGING — US US MFM OB FOLLOW-UP
1 series · 14 of 28 positions shown · non-contrast
Comparison: none

[Series 1: us mfm ob follow-up · 49 acquisitions, 14 frames shown]
[im 2/49]
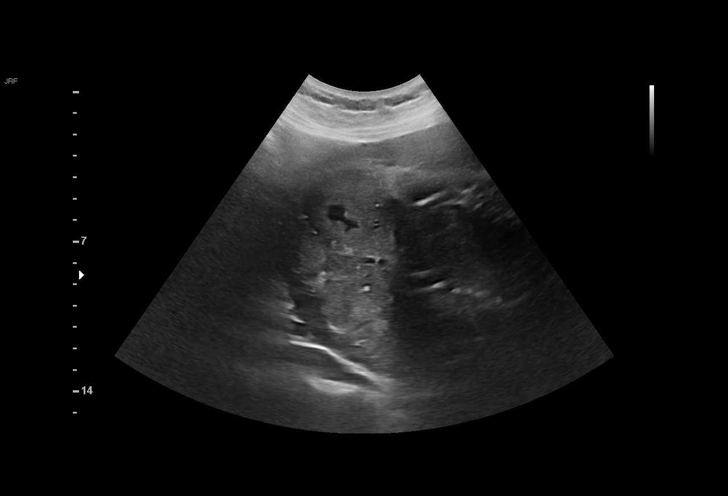
[im 6/49]
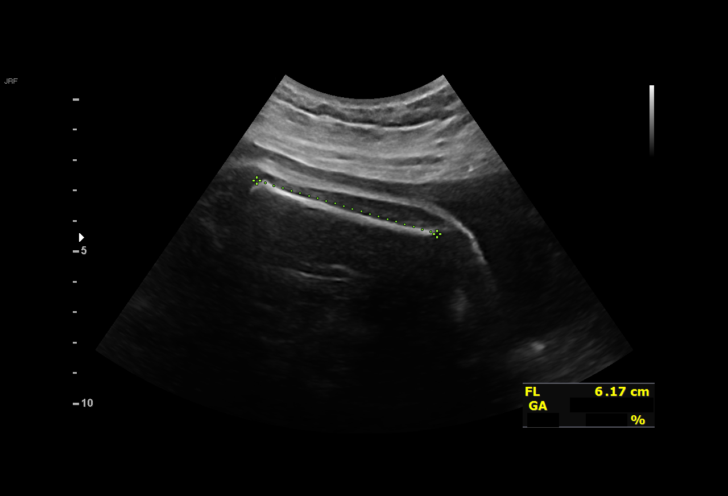
[im 9/49]
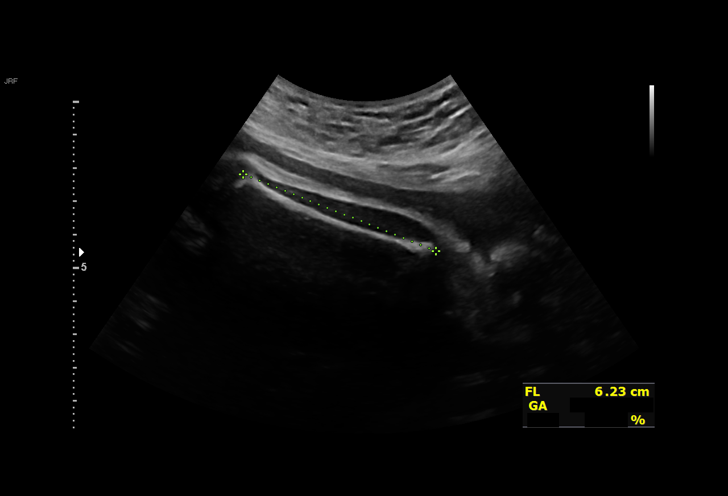
[im 13/49]
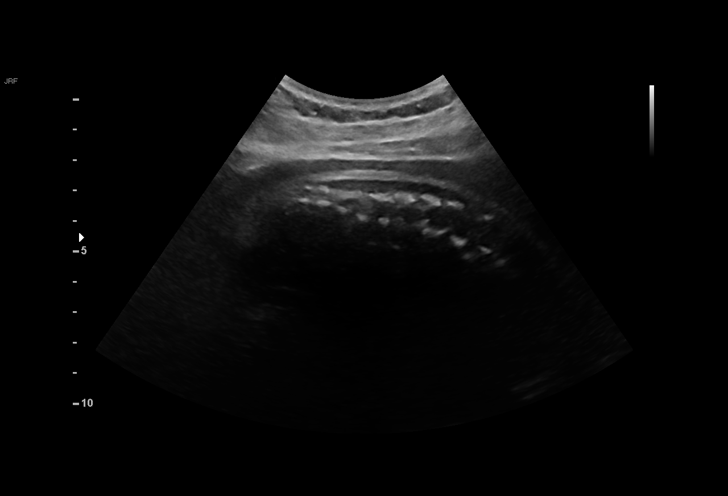
[im 17/49]
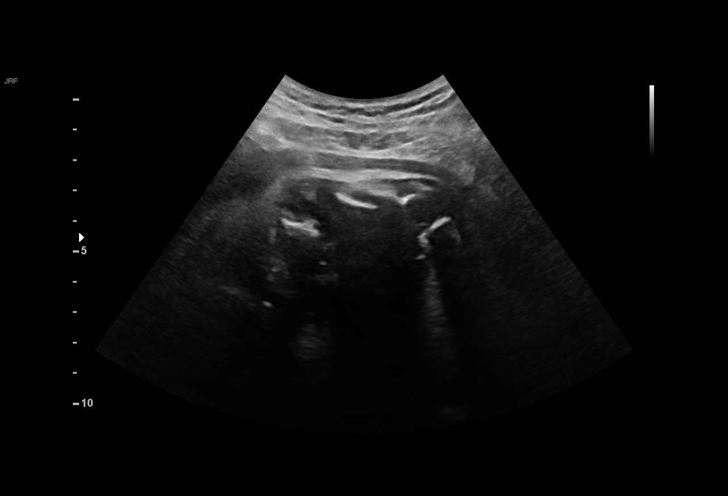
[im 20/49]
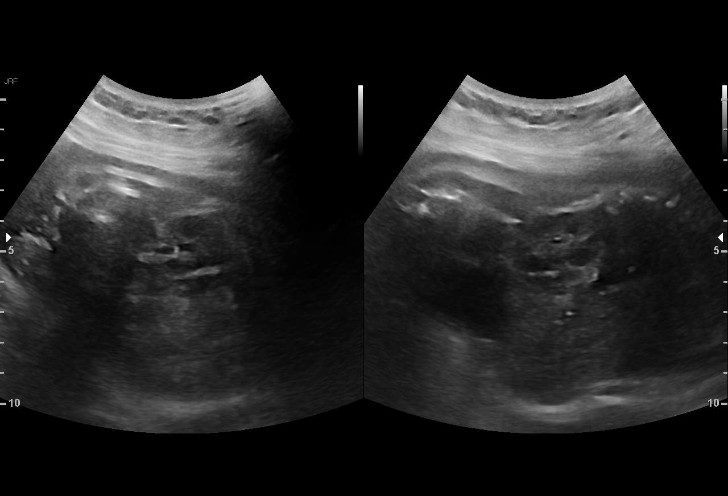
[im 24/49]
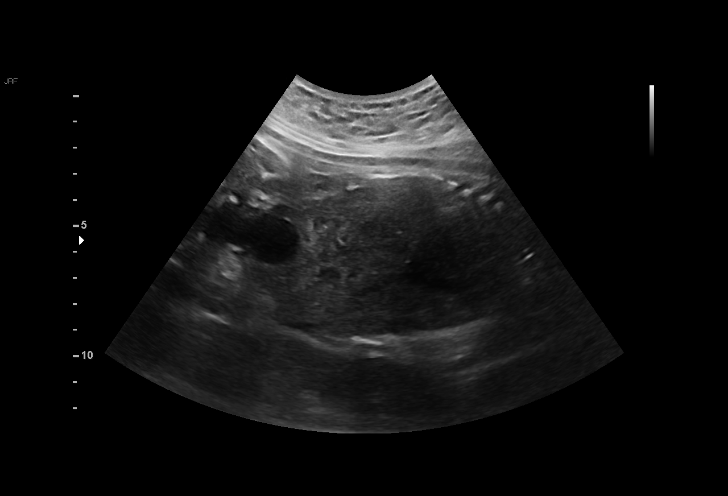
[im 27/49]
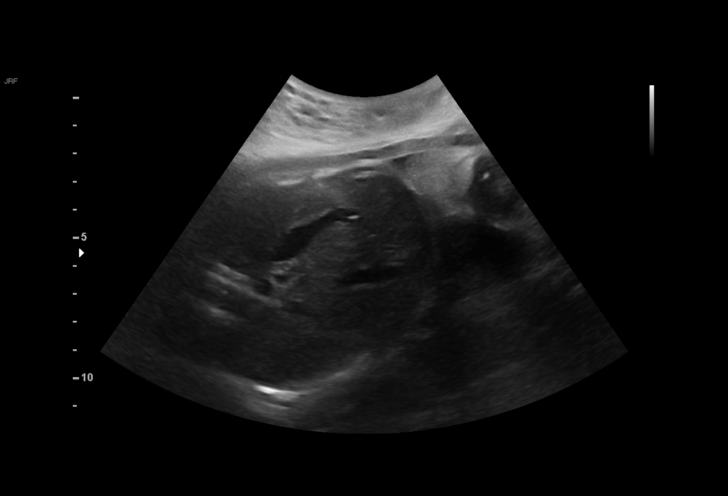
[im 31/49]
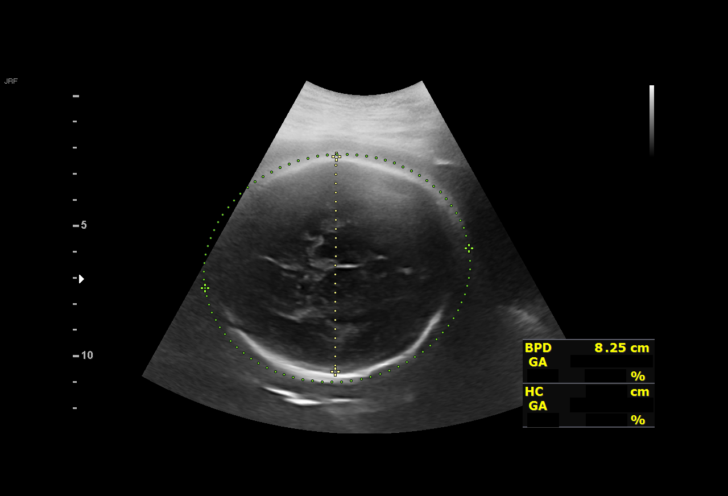
[im 34/49]
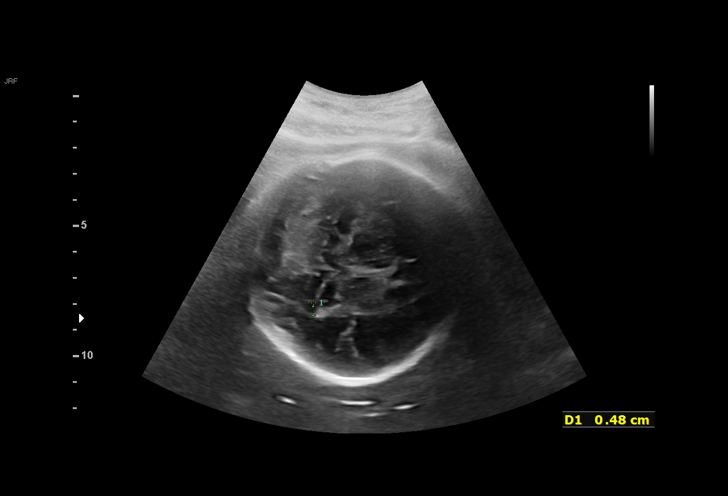
[im 38/49]
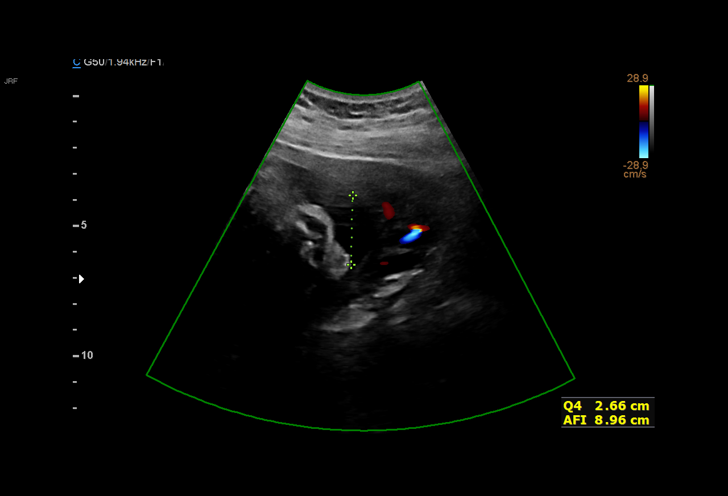
[im 41/49]
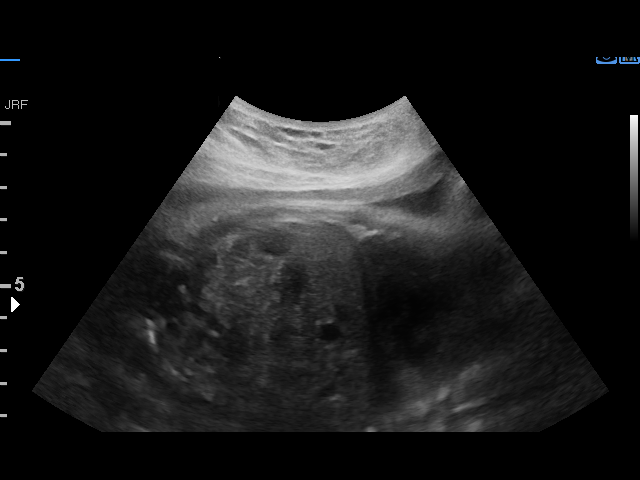
[im 45/49]
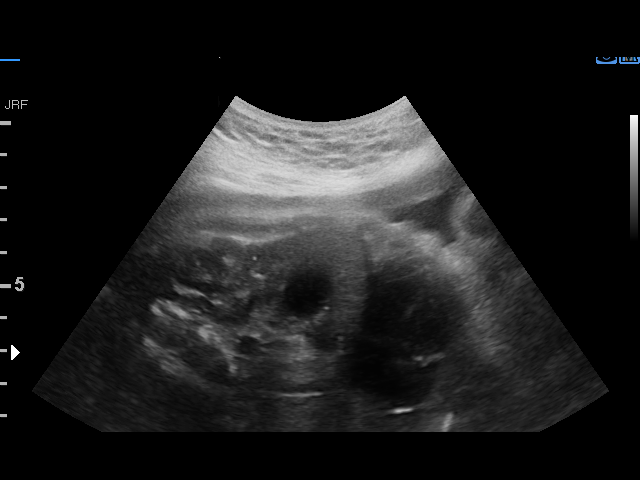
[im 49/49]
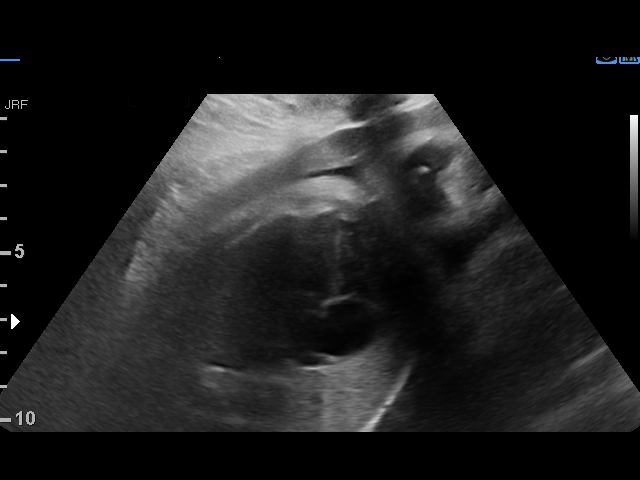

[14 of 28 positions shown; findings below may reference images not displayed]

Indications

 33 weeks gestation of pregnancy
 Pre-existing essential hypertension
 complicating pregnancy, third trimester

Fetal Evaluation

 Num Of Fetuses:         1
 Fetal Heart Rate(bpm):  157
 Cardiac Activity:       Observed
 Presentation:           Cephalic
 Placenta:               Fundal

 Amniotic Fluid
 AFI FV:      Within normal limits

 AFI Sum(cm)     %Tile       Largest Pocket(cm)
 9               11

 RUQ(cm)       RLQ(cm)       LUQ(cm)        LLQ(cm)
 0
Biophysical Evaluation
 Amniotic F.V:   Within normal limits       F. Tone:        Observed
 F. Movement:    Observed                   Score:          [DATE]
 F. Breathing:   Observed
Biometry

 BPD:      83.4  mm     G. Age:  33w 4d         53  %    CI:         77.4   %    70 - 86
                                                         FL/HC:      20.7   %    19.9 -
 HC:      300.1  mm     G. Age:  33w 2d         15  %    HC/AC:      1.05        0.96 -
 AC:      284.5  mm     G. Age:  32w 3d         28  %    FL/BPD:     74.5   %    71 - 87
 FL:       62.1  mm     G. Age:  32w 1d         14  %    FL/AC:      21.8   %    20 - 24
 HUM:      55.4  mm     G. Age:  32w 2d         37  %

 Est. FW:    1226  gm      4 lb 7 oz     22  %
Gestational Age

 LMP:           33w 2d        Date:  11/29/19                 EDD:   09/04/20
 U/S Today:     32w 6d                                        EDD:   09/07/20
 Best:          33w 2d     Det. By:  LMP  (11/29/19)          EDD:   09/04/20
Anatomy

 Cranium:               Appears normal         Aortic Arch:            Previously seen
 Cavum:                 Appears normal         Ductal Arch:            Previously seen
 Ventricles:            Appears normal         Diaphragm:              Appears normal
 Choroid Plexus:        Previously seen        Stomach:                Appears normal, left
                                                                       sided
 Cerebellum:            Previously seen        Abdomen:                Appears normal
 Posterior Fossa:       Previously seen        Abdominal Wall:         Previously seen
 Nuchal Fold:           Not applicable (>20    Cord Vessels:           Previously seen
                        wks GA)
 Face:                  Profile previously     Kidneys:                Appear normal
                        seen
 Lips:                  Previously seen        Bladder:                Appears normal
 Heart:                 Previously seen        Spine:                  Appears normal
 RVOT:                  Previously seen        Upper Extremities:      Previously seen
 LVOT:                  Previously seen        Lower Extremities:      Previously seen
Impression

 Follow up growth due to chronic hypertension on procardia
 Normal interval growth with measurements consistent with
 dates
 Good fetal movement and amniotic fluid volume
 Biophysical profile [DATE]
Recommendations

 Follow up growth in 4 weeks
 Continue weekly testing.
 Consider delivery between 37-39 weeks.

## 2021-11-17 IMAGING — US US FETAL BPP W/ NON-STRESS
1 series · 10 of 10 positions shown · non-contrast
Comparison: none

[Series 1: us fetal bpp w/ non-stress · 10 acquisitions, 10 frames shown]
[im 1/10]
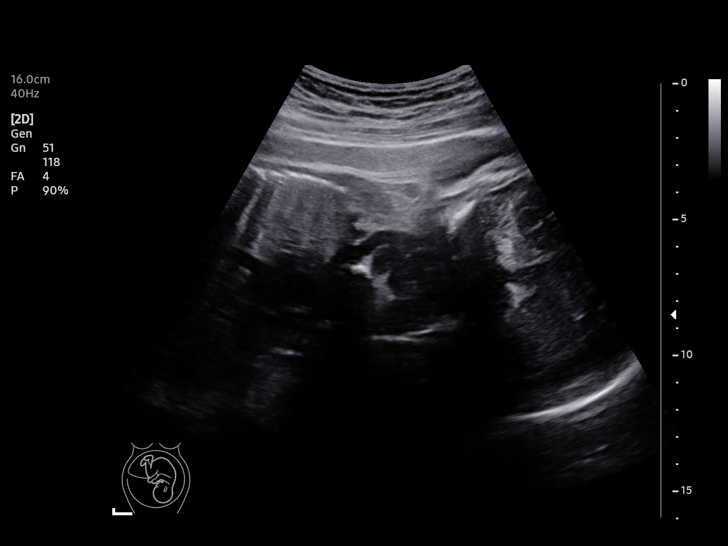
[im 2/10]
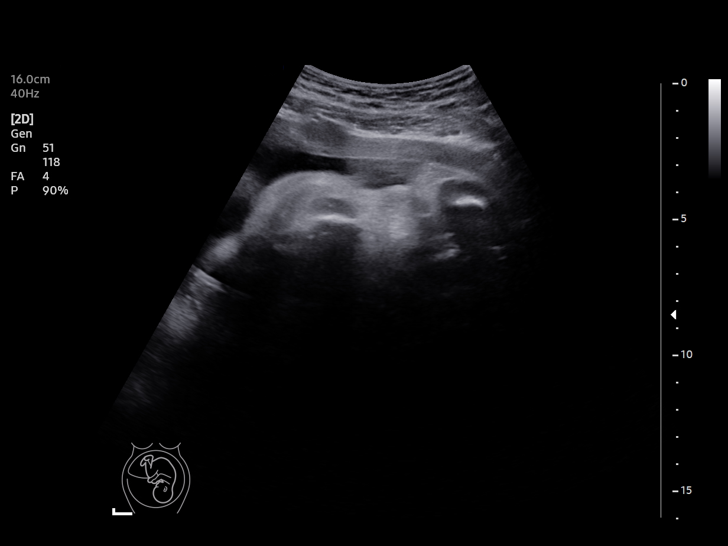
[im 3/10]
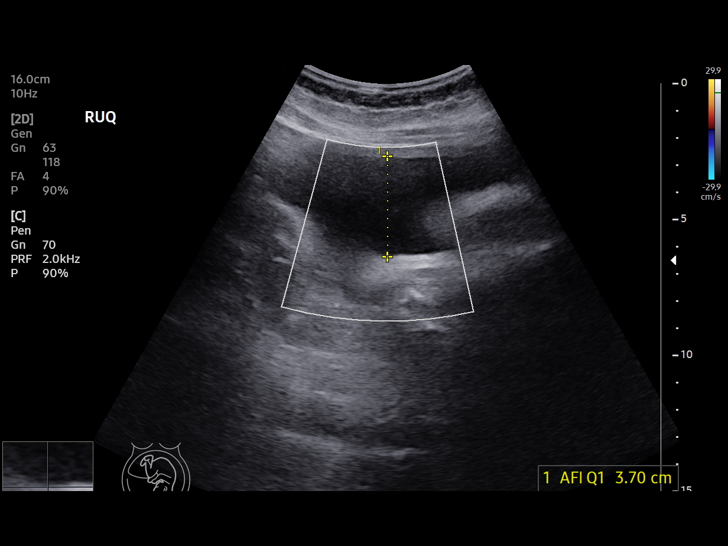
[im 4/10]
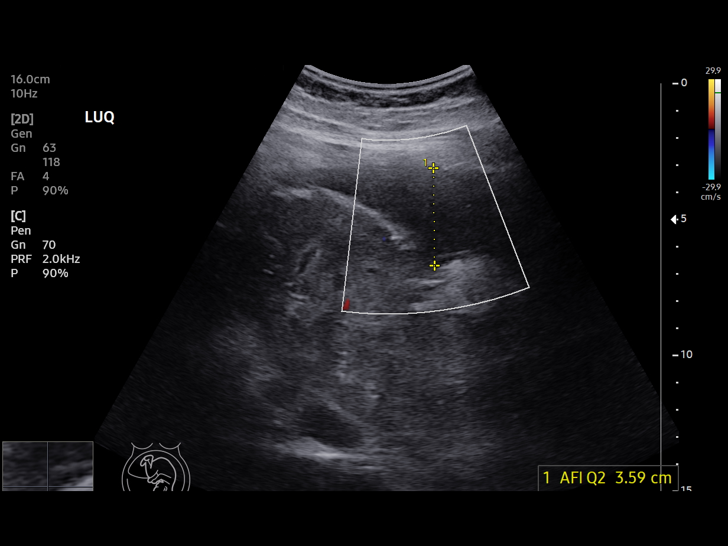
[im 5/10]
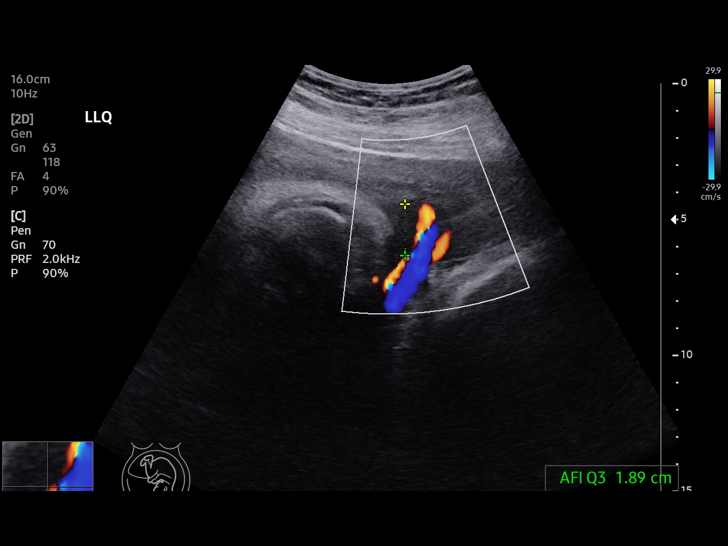
[im 6/10]
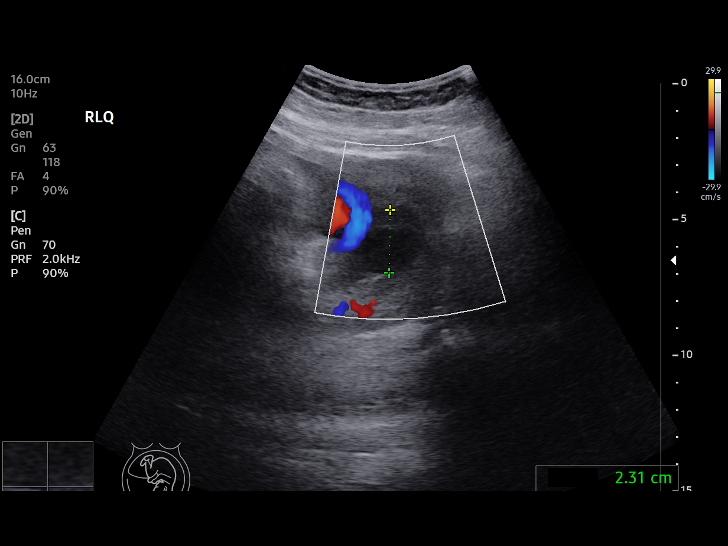
[im 7/10]
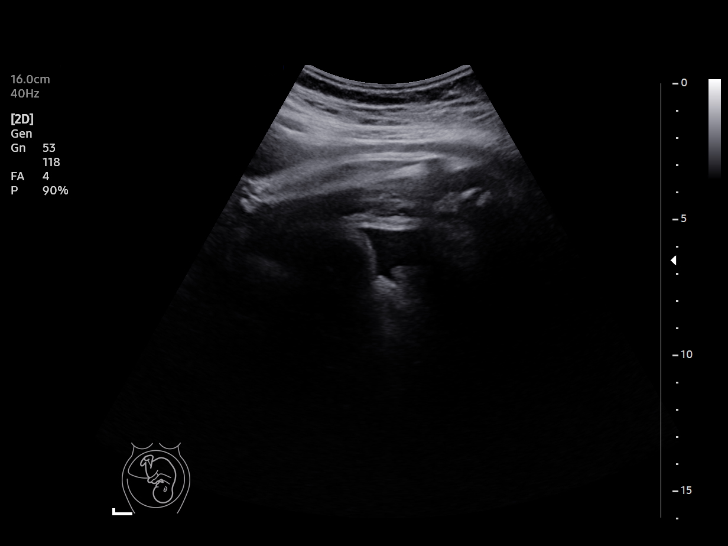
[im 8/10]
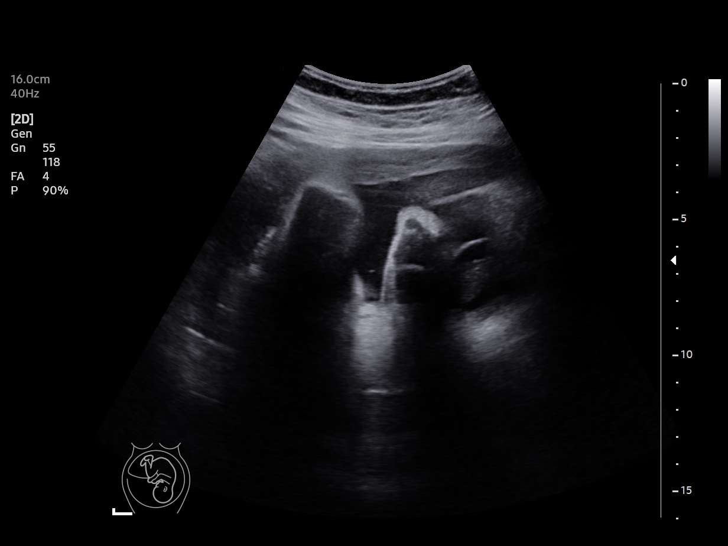
[im 9/10]
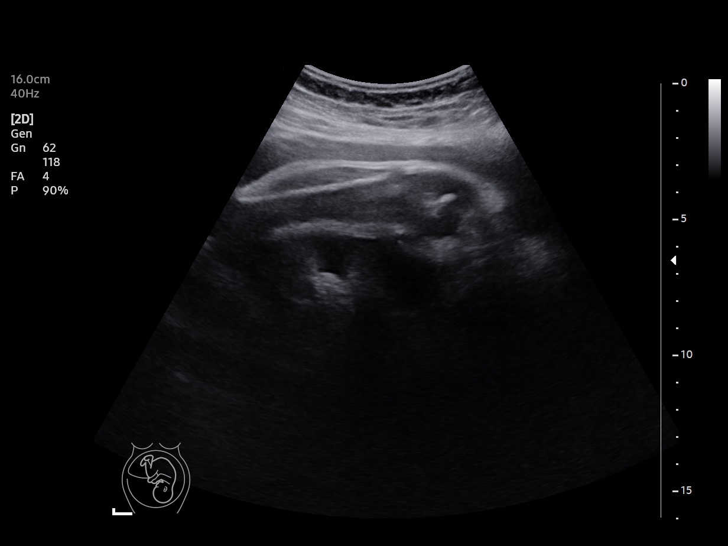
[im 10/10]
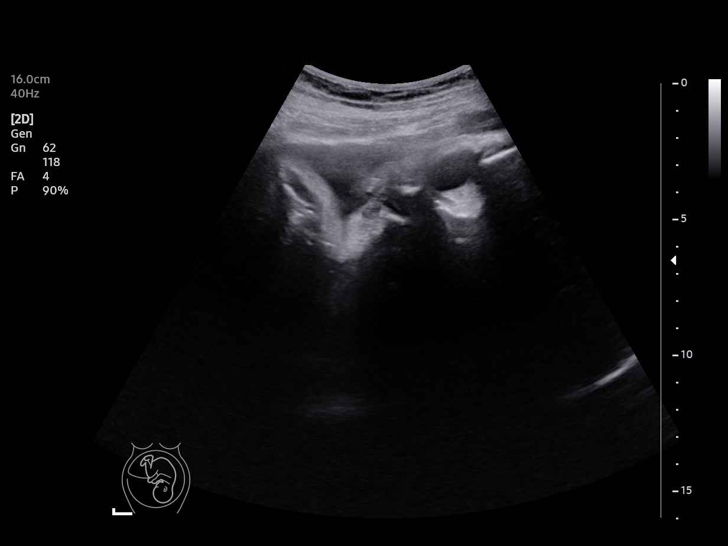

[10 of 10 positions shown; findings below may reference images not displayed]

Healthcare at

Service(s) Provided

Indications

 35 weeks gestation of pregnancy
 Hypertension - Chronic/Pre-existing
 Pre-eclampsia
Fetal Evaluation

 Num Of Fetuses:          1
 Preg. Location:          Intrauterine
 Cardiac Activity:        Observed
 Presentation:            Cephalic

 Amniotic Fluid
 AFI FV:      Within normal limits

 AFI Sum(cm)     %Tile       Largest Pocket(cm)
 11.49           32

 RUQ(cm)       RLQ(cm)        LUQ(cm)        LLQ(cm)


 Comment:     [DATE] BPP, -2 for breathing
Biophysical Evaluation

 Amniotic F.V:   Pocket => 2 cm              F. Tone:         Observed
 F. Movement:    Observed                    N.S.T:           Reactive
 F. Breathing:   Not Observed                Score:           [DATE]
Gestational Age

 LMP:            35w 4d       Date:  11/29/19                   EDD:  09/04/20
 Best:           35w 4d    Det. By:  LMP  (11/29/19)            EDD:  09/04/20

## 2021-12-04 ENCOUNTER — Telehealth: Payer: BC Managed Care – PPO | Admitting: Emergency Medicine

## 2021-12-04 ENCOUNTER — Telehealth: Payer: BC Managed Care – PPO | Admitting: Physician Assistant

## 2021-12-04 DIAGNOSIS — J069 Acute upper respiratory infection, unspecified: Secondary | ICD-10-CM

## 2021-12-04 MED ORDER — BENZONATATE 100 MG PO CAPS: 100.0000 mg | ORAL_CAPSULE | Freq: Two times a day (BID) | ORAL | 0 refills | Status: DC | PRN

## 2021-12-04 MED ORDER — IPRATROPIUM BROMIDE 0.03 % NA SOLN: 2.0000 | Freq: Two times a day (BID) | NASAL | 0 refills | Status: DC

## 2021-12-04 MED ORDER — FLUTICASONE PROPIONATE 50 MCG/ACT NA SUSP: 2.0000 | Freq: Every day | NASAL | 0 refills | Status: DC

## 2021-12-04 MED ORDER — BENZONATATE 100 MG PO CAPS: 100.0000 mg | ORAL_CAPSULE | Freq: Three times a day (TID) | ORAL | 0 refills | Status: DC | PRN

## 2021-12-04 NOTE — Progress Notes (Signed)
E-Visit for Upper Respiratory Infection   We are sorry you are not feeling well.  Here is how we plan to help!  Based on what you have shared with me, it looks like you may have a viral upper respiratory infection.  Upper respiratory infections are caused by a large number of viruses; however, rhinovirus is the most common cause.   Symptoms vary from person to person, with common symptoms including sore throat, cough, fatigue or lack of energy and feeling of general discomfort.  A low-grade fever of up to 100.4 may present, but is often uncommon.  Symptoms vary however, and are closely related to a person's age or underlying illnesses.  The most common symptoms associated with an upper respiratory infection are nasal discharge or congestion, cough, sneezing, headache and pressure in the ears and face.  These symptoms usually persist for about 3 to 10 days, but can last up to 2 weeks.  It is important to know that upper respiratory infections do not cause serious illness or complications in most cases.    Upper respiratory infections can be transmitted from person to person, with the most common method of transmission being a person's hands.  The virus is able to live on the skin and can infect other persons for up to 2 hours after direct contact.  Also, these can be transmitted when someone coughs or sneezes; thus, it is important to cover the mouth to reduce this risk.  To keep the spread of the illness at bay, good hand hygiene is very important.  This is an infection that is most likely caused by a virus. There are no specific treatments other than to help you with the symptoms until the infection runs its course.  We are sorry you are not feeling well.  Here is how we plan to help!   For nasal congestion, you may use an oral decongestants such as Mucinex D or if you have glaucoma or high blood pressure use plain Mucinex.  Saline nasal spray or nasal drops can help and can safely be used as often as  needed for congestion.  For your congestion, I have prescribed Ipratropium Bromide nasal spray 0.03% two sprays in each nostril 2-3 times a day  If you do not have a history of heart disease, hypertension, diabetes or thyroid disease, prostate/bladder issues or glaucoma, you may also use Sudafed to treat nasal congestion.  It is highly recommended that you consult with a pharmacist or your primary care physician to ensure this medication is safe for you to take.     If you have a cough, you may use cough suppressants such as Delsym and Robitussin.  If you have glaucoma or high blood pressure, you can also use Coricidin HBP.   For cough I have prescribed for you A prescription cough medication called Tessalon Perles 100 mg. You may take 1-2 capsules every 8 hours as needed for cough  If you have a sore or scratchy throat, use a saltwater gargle-  to  teaspoon of salt dissolved in a 4-ounce to 8-ounce glass of warm water.  Gargle the solution for approximately 15-30 seconds and then spit.  It is important not to swallow the solution.  You can also use throat lozenges/cough drops and Chloraseptic spray to help with throat pain or discomfort.  Warm or cold liquids can also be helpful in relieving throat pain.  For headache, pain or general discomfort, you can use Ibuprofen or Tylenol as directed.     Some authorities believe that zinc sprays or the use of Echinacea may shorten the course of your symptoms.   HOME CARE Only take medications as instructed by your medical team. Be sure to drink plenty of fluids. Water is fine as well as fruit juices, sodas and electrolyte beverages. You may want to stay away from caffeine or alcohol. If you are nauseated, try taking small sips of liquids. How do you know if you are getting enough fluid? Your urine should be a pale yellow or almost colorless. Get rest. Taking a steamy shower or using a humidifier may help nasal congestion and ease sore throat pain. You can  place a towel over your head and breathe in the steam from hot water coming from a faucet. Using a saline nasal spray works much the same way. Cough drops, hard candies and sore throat lozenges may ease your cough. Avoid close contacts especially the very young and the elderly Cover your mouth if you cough or sneeze Always remember to wash your hands.   GET HELP RIGHT AWAY IF: You develop worsening fever. If your symptoms do not improve within 10 days You develop yellow or green discharge from your nose over 3 days. You have coughing fits You develop a severe head ache or visual changes. You develop shortness of breath, difficulty breathing or start having chest pain Your symptoms persist after you have completed your treatment plan  MAKE SURE YOU  Understand these instructions. Will watch your condition. Will get help right away if you are not doing well or get worse.  Thank you for choosing an e-visit.  Your e-visit answers were reviewed by a board certified advanced clinical practitioner to complete your personal care plan. Depending upon the condition, your plan could have included both over the counter or prescription medications.  Please review your pharmacy choice. Make sure the pharmacy is open so you can pick up prescription now. If there is a problem, you may contact your provider through MyChart messaging and have the prescription routed to another pharmacy.  Your safety is important to us. If you have drug allergies check your prescription carefully.   For the next 24 hours you can use MyChart to ask questions about today's visit, request a non-urgent call back, or ask for a work or school excuse. You will get an email in the next two days asking about your experience. I hope that your e-visit has been valuable and will speed your recovery.  I have spent 5 minutes in review of e-visit questionnaire, review and updating patient chart, medical decision making and response to  patient.   Minas Bonser [address], PA-C  

## 2021-12-04 NOTE — Progress Notes (Signed)
E-Visit for Upper Respiratory Infection   We are sorry you are not feeling well.  Here is how we plan to help!  Based on what you have shared with me, it looks like you may have a viral upper respiratory infection.  Upper respiratory infections are caused by a large number of viruses; however, rhinovirus is the most common cause.   We are seeing a lot of this right now.  Unfortunately, it'll probably last another 3-5 days.  I've sent some medicine to the pharmacy to help with the cough.  Symptoms vary from person to person, with common symptoms including sore throat, cough, fatigue or lack of energy and feeling of general discomfort.  A low-grade fever of up to 100.4 may present, but is often uncommon.  Symptoms vary however, and are closely related to a person's age or underlying illnesses.  The most common symptoms associated with an upper respiratory infection are nasal discharge or congestion, cough, sneezing, headache and pressure in the ears and face.  These symptoms usually persist for about 3 to 10 days, but can last up to 2 weeks.  It is important to know that upper respiratory infections do not cause serious illness or complications in most cases.    Upper respiratory infections can be transmitted from person to person, with the most common method of transmission being a person's hands.  The virus is able to live on the skin and can infect other persons for up to 2 hours after direct contact.  Also, these can be transmitted when someone coughs or sneezes; thus, it is important to cover the mouth to reduce this risk.  To keep the spread of the illness at bay, good hand hygiene is very important.  This is an infection that is most likely caused by a virus. There are no specific treatments other than to help you with the symptoms until the infection runs its course.  We are sorry you are not feeling well.  Here is how we plan to help!   For nasal congestion, you may use an oral decongestants  such as Mucinex D or if you have glaucoma or high blood pressure use plain Mucinex.  Saline nasal spray or nasal drops can help and can safely be used as often as needed for congestion.  For your congestion, I have prescribed Fluticasone nasal spray one spray in each nostril twice a day  If you do not have a history of heart disease, hypertension, diabetes or thyroid disease, prostate/bladder issues or glaucoma, you may also use Sudafed to treat nasal congestion.  It is highly recommended that you consult with a pharmacist or your primary care physician to ensure this medication is safe for you to take.     If you have a cough, you may use cough suppressants such as Delsym and Robitussin.  If you have glaucoma or high blood pressure, you can also use Coricidin HBP.   For cough I have prescribed for you A prescription cough medication called Tessalon Perles 100 mg. You may take 1-2 capsules every 8 hours as needed for cough  If you have a sore or scratchy throat, use a saltwater gargle-  to  teaspoon of salt dissolved in a 4-ounce to 8-ounce glass of warm water.  Gargle the solution for approximately 15-30 seconds and then spit.  It is important not to swallow the solution.  You can also use throat lozenges/cough drops and Chloraseptic spray to help with throat pain or discomfort.  Warm  or cold liquids can also be helpful in relieving throat pain.  For headache, pain or general discomfort, you can use Ibuprofen or Tylenol as directed.   Some authorities believe that zinc sprays or the use of Echinacea may shorten the course of your symptoms.   HOME CARE Only take medications as instructed by your medical team. Be sure to drink plenty of fluids. Water is fine as well as fruit juices, sodas and electrolyte beverages. You may want to stay away from caffeine or alcohol. If you are nauseated, try taking small sips of liquids. How do you know if you are getting enough fluid? Your urine should be a pale  yellow or almost colorless. Get rest. Taking a steamy shower or using a humidifier may help nasal congestion and ease sore throat pain. You can place a towel over your head and breathe in the steam from hot water coming from a faucet. Using a saline nasal spray works much the same way. Cough drops, hard candies and sore throat lozenges may ease your cough. Avoid close contacts especially the very young and the elderly Cover your mouth if you cough or sneeze Always remember to wash your hands.   GET HELP RIGHT AWAY IF: You develop worsening fever. If your symptoms do not improve within 10 days You develop yellow or green discharge from your nose over 3 days. You have coughing fits You develop a severe head ache or visual changes. You develop shortness of breath, difficulty breathing or start having chest pain Your symptoms persist after you have completed your treatment plan  MAKE SURE YOU  Understand these instructions. Will watch your condition. Will get help right away if you are not doing well or get worse.  Thank you for choosing an e-visit.  Your e-visit answers were reviewed by a board certified advanced clinical practitioner to complete your personal care plan. Depending upon the condition, your plan could have included both over the counter or prescription medications.  Please review your pharmacy choice. Make sure the pharmacy is open so you can pick up prescription now. If there is a problem, you may contact your provider through [employer] and have the prescription routed to another pharmacy.  Your safety is important to [place]. If you have drug allergies check your prescription carefully.   For the next 24 hours you can use MyChart to ask questions about today's visit, request a non-urgent call back, or ask for a work or school excuse. You will get an email in the next two days asking about your experience. I hope that your e-visit has been valuable and will speed your  recovery.    Approximately 5 minutes was used in reviewing the patient's chart, questionnaire, prescribing medications, and documentation.

## 2022-01-08 ENCOUNTER — Telehealth: Payer: BC Managed Care – PPO | Admitting: Emergency Medicine

## 2022-01-08 DIAGNOSIS — H6993 Unspecified Eustachian tube disorder, bilateral: Secondary | ICD-10-CM | POA: Diagnosis not present

## 2022-01-08 MED ORDER — FLUTICASONE PROPIONATE 50 MCG/ACT NA SUSP: 2.0000 | Freq: Every day | NASAL | 0 refills | Status: DC

## 2022-01-08 NOTE — Progress Notes (Signed)
E-Visit for Ear Pressure - Eustachian Tube Dysfunction   We are sorry that you are not feeling well. Here is how we plan to help!  Based on what you have shared with me it looks like you have Eustachian Tube Dysfunction.  Eustachian Tube Dysfunction is a condition where the tubes that connect your middle ears to your upper throat become blocked. This can lead to discomfort, hearing difficulties and a feeling of fullness in your ear. Eustachian tube dysfunction usually resolves itself in a few days. The usual symptoms include: Hearing problems Tinnitus, or ringing in your ears Clicking or popping sounds A feeling of fullness in your ears Pain that mimics an ear infection Dizziness, vertigo or balance problems A "tickling" sensation in your ears  ?Eustachian tube dysfunction symptoms may get worse in higher altitudes. This is called barotrauma, and it can happen while scuba diving, flying in an airplane or driving in the mountains.   What causes eustachian tube dysfunction? Allergies and infections (like the common cold and the flu) are the most common causes of eustachian tube dysfunction. These conditions can cause inflammation and mucus buildup, leading to blockage. GERD, or chronic acid reflux, can also cause ETD. This is because stomach acid can back up into your throat and result in inflammation. As mentioned above, altitude changes can also cause ETD.   What are some common eustachian tube dysfunction treatments? In most cases, treatment isn't necessary because ETD often resolves on its own. However, you might need treatment if your symptoms linger for more than two weeks.   In your case, I think you should try using nasal saline and Mucinex to relieve the fullness in your ear.   Use Mucinex as directed on the package. Drink lots of liquids with it.   You can try nasal saline spray, or try using saline irrigation, such as with a neti pot, until your symptoms improve. Many neti pots  come with salt packets premeasured to use to make saline. If you use your own salt, make sure it is kosher salt or sea salt (don't use table salt as it has iodine in it and you don't need that in your nose). Use distilled water to make saline. If you mix your own saline using your own salt, the recipe is 1/4 teaspoon salt in 1 cup warm water. Using saline irrigation can help prevent and treat sinus infections.    Eustachian tube dysfunction treatment depends on the cause and the severity of your condition. Treatments may include home remedies, medications or, in severe cases, surgery.     HOME CARE: Sometimes simple home remedies can help with mild cases of eustachian tube dysfunction. To try and clear the blockage, you can: Chew gum. Yawn. Swallow. Try the Valsalva maneuver (breathing out forcefully while closing your mouth and pinching your nostrils). Use a saline spray to clear out nasal passages.  MEDICATIONS: Over-the-counter medications can help if allergies are causing eustachian tube dysfunction. Try antihistamines (like cetirizine or diphenhydramine) to ease your symptoms. If you have discomfort, pain relievers -- such as acetaminophen or ibuprofen -- can help.  Sometimes intranasal glucocorticosteroids (like Flonase or Nasacort) help.  I have prescribed Fluticasone 50 mcg/spray 2 sprays in each nostril daily for 10-14 days    GET HELP RIGHT AWAY IF: Fever is over 102.2 degrees. You develop progressive ear pain or hearing loss. Ear symptoms persist longer than 3 days after treatment.  MAKE SURE YOU: Understand these instructions. Will watch your condition. Will get  help right away if you are not doing well or get worse.  Thank you for choosing an e-visit.  Your e-visit answers were reviewed by a board certified advanced clinical practitioner to complete your personal care plan. Depending upon the condition, your plan could have included both over the counter or prescription  medications.  Please review your pharmacy choice. Make sure the pharmacy is open so you can pick up the prescription now. If there is a problem, you may contact your provider through [employer] and have the prescription routed to another pharmacy.  Your safety is important to [place]. If you have drug allergies check your prescription carefully.   For the next 24 hours you can use MyChart to ask questions about today's visit, request a non-urgent call back, or ask for a work or school excuse. You will get an email with a survey after your eVisit asking about your experience. We would appreciate your feedback. I hope that your e-visit has been valuable and will aid in your recovery.   I have spent 5 minutes in review of e-visit questionnaire, review and updating patient chart, medical decision making and response to patient.   Willeen Cass, PhD, FNP-BC

## 2022-01-22 ENCOUNTER — Telehealth: Payer: BC Managed Care – PPO | Admitting: Physician Assistant

## 2022-01-22 DIAGNOSIS — L299 Pruritus, unspecified: Secondary | ICD-10-CM

## 2022-01-23 NOTE — Progress Notes (Signed)
Because these symptoms could be coming from various causes (ear wax and impaction, dermatitis of ear canal, eczema in the ear, etc) that I cannot fully examine, I feel your condition warrants further evaluation and I recommend that you be seen in a face to face visit.   NOTE: There will be NO CHARGE for this eVisit   If you are having a true medical emergency please call 911.      For an urgent face to face visit, [facility] has eight urgent care centers for your convenience:   NEW!! [facility] [place] [facility] [place] Get Driving Directions [phone] [address] [place], [place] [place] [facility] [place] Get Driving Directions [phone] [place], [place] [address]   [facility]) Get Driving Directions [phone] [address], [place] [address]  [facility] [place] ([place]) Get Driving Directions [phone] [address] [place],  [place]  [address]  [facility] at [address] Get Driving Directions  [phone] [address] [place],  [place] [address]   [facility] Get Driving Directions [phone] [address], [place], [place] [address]   [facility] Get Driving Directions  [phone] [address] [place], [place] [address]   [facility] [place] Get Driving Directions [phone] [address] [place], [place] [address]  Your MyChart E-visit questionnaire answers were reviewed by a board certified advanced clinical practitioner to complete your personal care plan based on your specific symptoms.  Thank you for using e-Visits.

## 2022-01-27 ENCOUNTER — Telehealth: Payer: BC Managed Care – PPO | Admitting: Physician Assistant

## 2022-01-27 DIAGNOSIS — H938X1 Other specified disorders of right ear: Secondary | ICD-10-CM

## 2022-01-27 NOTE — Progress Notes (Signed)
Because of your history of "fungal infection" in ear, I feel your condition warrants further evaluation and I recommend that you be seen for a face to face visit.  Please contact your primary care physician practice to be seen. Many offices offer virtual options to be seen via video if you are not comfortable going in person to a medical facility at this time.  NOTE: You will NOT be charged for this eVisit.  If you do not have a PCP,  offers a free physician referral service available at [phone]. Our trained staff has the experience, knowledge and resources to put you in touch with a physician who is right for you.    If you are having a true medical emergency please call 911.   Your e-visit answers were reviewed by a board certified advanced clinical practitioner to complete your personal care plan.  Thank you for using e-Visits.

## 2022-01-29 ENCOUNTER — Telehealth: Payer: BC Managed Care – PPO | Admitting: Family Medicine

## 2022-01-29 DIAGNOSIS — L299 Pruritus, unspecified: Secondary | ICD-10-CM

## 2022-01-29 NOTE — Progress Notes (Signed)
Veronica Castillo   Has been seen a few times over EV- but needs to go to UC to have ears examined in person to decided on best course of treatment.  Patient acknowledged agreement and understanding of the plan.

## 2022-01-30 ENCOUNTER — Ambulatory Visit
Admission: RE | Admit: 2022-01-30 | Discharge: 2022-01-30 | Disposition: A | Discharge status: Home / Self Care | Payer: BC Managed Care – PPO | Source: Ambulatory Visit | Attending: Nurse Practitioner | Admitting: Nurse Practitioner

## 2022-01-30 VITALS — BP 160/94 | HR 77 | Temp 99.1°F | Resp 18

## 2022-01-30 DIAGNOSIS — H9311 Tinnitus, right ear: Secondary | ICD-10-CM

## 2022-01-30 DIAGNOSIS — L299 Pruritus, unspecified: Secondary | ICD-10-CM | POA: Diagnosis not present

## 2022-01-30 DIAGNOSIS — H6993 Unspecified Eustachian tube disorder, bilateral: Secondary | ICD-10-CM | POA: Diagnosis not present

## 2022-01-30 MED ORDER — PREDNISONE 20 MG PO TABS: 40.0000 mg | ORAL_TABLET | Freq: Every day | ORAL | 0 refills | Status: AC

## 2022-01-30 MED ORDER — FLUTICASONE PROPIONATE 50 MCG/ACT NA SUSP: 1.0000 | Freq: Every day | NASAL | 0 refills | Status: DC

## 2022-01-30 NOTE — ED Triage Notes (Signed)
Patient to Urgent Care with complaints of right sided ear itching and fullness. Reports hearing a whooshing sound in her right ear.   Ringing in her ear started one month ago. Itching in her ear started approx one year ago.  Patient was seen by her pcp and was prescribed some drops (acetic acid-hydrocortisone) for a possible fungal infection in January of 2023. Patient reports they didn't help.
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
## 2022-01-30 NOTE — Discharge Instructions (Signed)
Start Flonase daily Prednisone 40 mg daily for 5 days Avoid Q-tips or earbuds Please follow-up with your PCP in 2 to 3 days for recheck Please go to the emergency room if you have any worsening symptoms

## 2022-02-20 ENCOUNTER — Telehealth: Payer: BC Managed Care – PPO | Admitting: Physician Assistant

## 2022-02-20 DIAGNOSIS — H6991 Unspecified Eustachian tube disorder, right ear: Secondary | ICD-10-CM | POA: Diagnosis not present

## 2022-02-20 MED ORDER — IPRATROPIUM BROMIDE 0.03 % NA SOLN: 2.0000 | Freq: Two times a day (BID) | NASAL | 0 refills | Status: AC

## 2022-02-20 NOTE — Progress Notes (Signed)
E-Visit for Ear Pain - Eustachian Tube Dysfunction   We are sorry that you are not feeling well. Here is how we plan to help!  Based on what you have shared with me it looks like you have Eustachian Tube Dysfunction.  Eustachian Tube Dysfunction is a condition where the tubes that connect your middle ears to your upper throat become blocked. This can lead to discomfort, hearing difficulties and a feeling of fullness in your ear. Eustachian tube dysfunction usually resolves itself in a few days. The usual symptoms include: Hearing problems Tinnitus, or ringing in your ears Clicking or popping sounds A feeling of fullness in your ears Pain that mimics an ear infection Dizziness, vertigo or balance problems A "tickling" sensation in your ears  ?Eustachian tube dysfunction symptoms may get worse in higher altitudes. This is called barotrauma, and it can happen while scuba diving, flying in an airplane or driving in the mountains.   What causes eustachian tube dysfunction? Allergies and infections (like the common cold and the flu) are the most common causes of eustachian tube dysfunction. These conditions can cause inflammation and mucus buildup, leading to blockage. GERD, or chronic acid reflux, can also cause ETD. This is because stomach acid can back up into your throat and result in inflammation. As mentioned above, altitude changes can also cause ETD.   What are some common eustachian tube dysfunction treatments? In most cases, treatment isn't necessary because ETD often resolves on its own. However, you might need treatment if your symptoms linger for more than two weeks.    Eustachian tube dysfunction treatment depends on the cause and the severity of your condition. Treatments may include home remedies, medications or, in severe cases, surgery.     HOME CARE: Sometimes simple home remedies can help with mild cases of eustachian tube dysfunction. To try and clear the blockage, you  can: Chew gum. Yawn. Swallow. Try the Valsalva maneuver (breathing out forcefully while closing your mouth and pinching your nostrils). Use a saline spray to clear out nasal passages.  MEDICATIONS: Over-the-counter medications can help if allergies are causing eustachian tube dysfunction. Try antihistamines (like cetirizine or diphenhydramine) to ease your symptoms. If you have discomfort, pain relievers -- such as acetaminophen or ibuprofen -- can help.  Sometimes intranasal glucocorticosteroids (like Flonase or Nasacort) help.  I have prescribed Ipratropium Bromide nasal spray 0.03% two sprays in each nostril 2-3 times a day, use with Flonase nasal spray previously prescribed as well.     GET HELP RIGHT AWAY IF: Fever is over 102.2 degrees. You develop progressive ear pain or hearing loss. Ear symptoms persist longer than 3 days after treatment.  MAKE SURE YOU: Understand these instructions. Will watch your condition. Will get help right away if you are not doing well or get worse.  Thank you for choosing an e-visit.  Your e-visit answers were reviewed by a board certified advanced clinical practitioner to complete your personal care plan. Depending upon the condition, your plan could have included both over the counter or prescription medications.  Please review your pharmacy choice. Make sure the pharmacy is open so you can pick up the prescription now. If there is a problem, you may contact your provider through [employer] and have the prescription routed to another pharmacy.  Your safety is important to [place]. If you have drug allergies check your prescription carefully.   For the next 24 hours you can use MyChart to ask questions about today's visit, request a non-urgent  call back, or ask for a work or school excuse. You will get an email with a survey after your eVisit asking about your experience. We would appreciate your feedback. I hope that your e-visit has been valuable  and will aid in your recovery.  I have spent 5 minutes in review of e-visit questionnaire, review and updating patient chart, medical decision making and response to patient.   Mar Daring, PA-C

## 2022-03-14 ENCOUNTER — Telehealth: Payer: BC Managed Care – PPO | Admitting: Family

## 2022-03-14 DIAGNOSIS — H6993 Unspecified Eustachian tube disorder, bilateral: Secondary | ICD-10-CM

## 2022-03-14 DIAGNOSIS — H9319 Tinnitus, unspecified ear: Secondary | ICD-10-CM

## 2022-03-14 MED ORDER — FLUTICASONE PROPIONATE 50 MCG/ACT NA SUSP: 2.0000 | Freq: Every day | NASAL | 6 refills | Status: DC

## 2022-03-14 MED ORDER — CETIRIZINE HCL 10 MG PO TABS: 10.0000 mg | ORAL_TABLET | Freq: Every day | ORAL | 1 refills | Status: AC

## 2022-03-14 MED ORDER — PREDNISONE 20 MG PO TABS: 40.0000 mg | ORAL_TABLET | Freq: Every day | ORAL | 0 refills | Status: AC

## 2022-03-14 NOTE — Progress Notes (Signed)
E-Visit for Ear Pain - Eustachian Tube Dysfunction   We are sorry that you are not feeling well. Here is how we plan to help!  Based on what you have shared with me it looks like you have Eustachian Tube Dysfunction.  Eustachian Tube Dysfunction is a condition where the tubes that connect your middle ears to your upper throat become blocked. This can lead to discomfort, hearing difficulties and a feeling of fullness in your ear. Eustachian tube dysfunction usually resolves itself in a few days. The usual symptoms include: Hearing problems Tinnitus, or ringing in your ears Clicking or popping sounds A feeling of fullness in your ears Pain that mimics an ear infection Dizziness, vertigo or balance problems A "tickling" sensation in your ears  ?Eustachian tube dysfunction symptoms may get worse in higher altitudes. This is called barotrauma, and it can happen while scuba diving, flying in an airplane or driving in the mountains.   What causes eustachian tube dysfunction? Allergies and infections (like the common cold and the flu) are the most common causes of eustachian tube dysfunction. These conditions can cause inflammation and mucus buildup, leading to blockage. GERD, or chronic acid reflux, can also cause ETD. This is because stomach acid can back up into your throat and result in inflammation. As mentioned above, altitude changes can also cause ETD.   What are some common eustachian tube dysfunction treatments? In most cases, treatment isn't necessary because ETD often resolves on its own. However, you might need treatment if your symptoms linger for more than two weeks.    Eustachian tube dysfunction treatment depends on the cause and the severity of your condition. Treatments may include home remedies, medications or, in severe cases, surgery.     HOME CARE: Sometimes simple home remedies can help with mild cases of eustachian tube dysfunction. To try and clear the blockage, you  can: Chew gum. Yawn. Swallow. Try the Valsalva maneuver (breathing out forcefully while closing your mouth and pinching your nostrils). Use a saline spray to clear out nasal passages.  MEDICATIONS: Over-the-counter medications can help if allergies are causing eustachian tube dysfunction. Try antihistamines (like cetirizine or diphenhydramine) to ease your symptoms. If you have discomfort, pain relievers -- such as acetaminophen or ibuprofen -- can help.  Sometimes intranasal glucocorticosteroids (like Flonase or Nasacort) help.  I have prescribed Fluticasone 50 mcg/spray 2 sprays in each nostril daily for 10-14 days, zyrtec 10 mg, and take a nasal decongestant. I have also sent prednisone 40 mg for 5 days.   If your symptoms do not improve you will need to see ENT.     GET HELP RIGHT AWAY IF: Fever is over 102.2 degrees. You develop progressive ear pain or hearing loss. Ear symptoms persist longer than 3 days after treatment.  MAKE SURE YOU: Understand these instructions. Will watch your condition. Will get help right away if you are not doing well or get worse.  Thank you for choosing an e-visit.  Your e-visit answers were reviewed by a board certified advanced clinical practitioner to complete your personal care plan. Depending upon the condition, your plan could have included both over the counter or prescription medications.  Please review your pharmacy choice. Make sure the pharmacy is open so you can pick up the prescription now. If there is a problem, you may contact your provider through [employer] and have the prescription routed to another pharmacy.  Your safety is important to [place]. If you have drug allergies check your prescription  carefully.   For the next 24 hours you can use MyChart to ask questions about today's visit, request a non-urgent call back, or ask for a work or school excuse. You will get an email with a survey after your eVisit asking about your  experience. We would appreciate your feedback. I hope that your e-visit has been valuable and will aid in your recovery.   Approximately 5 minutes was spent documenting and reviewing patient's chart.

## 2022-03-23 ENCOUNTER — Encounter: Payer: Self-pay | Admitting: Nurse Practitioner

## 2022-03-23 ENCOUNTER — Ambulatory Visit: Payer: BC Managed Care – PPO | Admitting: Nurse Practitioner

## 2022-03-23 VITALS — BP 136/92 | HR 87 | Temp 98.7°F | Resp 16 | Ht 65.98 in | Wt 215.4 lb

## 2022-03-23 DIAGNOSIS — I10 Essential (primary) hypertension: Secondary | ICD-10-CM | POA: Diagnosis not present

## 2022-03-23 DIAGNOSIS — E669 Obesity, unspecified: Secondary | ICD-10-CM | POA: Diagnosis not present

## 2022-03-23 DIAGNOSIS — H9311 Tinnitus, right ear: Secondary | ICD-10-CM | POA: Diagnosis not present

## 2022-03-23 MED ORDER — HYDROCHLOROTHIAZIDE 25 MG PO TABS: 25.0000 mg | ORAL_TABLET | Freq: Every day | ORAL | 1 refills | Status: DC

## 2022-03-23 NOTE — Assessment & Plan Note (Signed)
Patient with a history of gestational hypertension.  States postpartum her blood pressure back to normal has had elevated readings at urgent care and to here after recheck will start patient on hydrochlorothiazide 25 mg daily.  Did discuss that this is not safe during pregnancy patient has no plans to become pregnant in the immediate future.  She is not breast-feeding currently.  Follow-up 1 month for BMP recheck along with blood pressure recheck.

## 2022-03-23 NOTE — Assessment & Plan Note (Signed)
Will check labs inclusive of TSH and A1c.

## 2022-03-23 NOTE — Patient Instructions (Signed)
Nice to see you today I have sent in a blood pressure pill to take in the morning I want to see you in a month for a recheck, sooner if you need me I have also referred you to ENT

## 2022-03-23 NOTE — Assessment & Plan Note (Signed)
Patient currently on Claritin, Astelin, Flonase nasal spray.  Some improvement.  Patient did do a course of prednisone also.  Ambulatory referral to ENT made today.
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
## 2022-03-24 LAB — COMPREHENSIVE METABOLIC PANEL
AG Ratio: 1.3 (calc) (ref 1.0–2.5)
ALT: 19 U/L (ref 6–29)
AST: 21 U/L (ref 10–30)
Albumin: 4.1 g/dL (ref 3.6–5.1)
Alkaline phosphatase (APISO): 71 U/L (ref 31–125)
BUN: 12 mg/dL (ref 7–25)
CO2: 25 mmol/L (ref 20–32)
Calcium: 9.6 mg/dL (ref 8.6–10.2)
Chloride: 103 mmol/L (ref 98–110)
Creat: 0.79 mg/dL (ref 0.50–0.96)
Globulin: 3.2 g/dL (calc) (ref 1.9–3.7)
Glucose, Bld: 93 mg/dL (ref 65–99)
Potassium: 4.4 mmol/L (ref 3.5–5.3)
Sodium: 138 mmol/L (ref 135–146)
Total Bilirubin: 0.2 mg/dL (ref 0.2–1.2)
Total Protein: 7.3 g/dL (ref 6.1–8.1)

## 2022-03-24 LAB — CBC
HCT: 37 % (ref 35.0–45.0)
Hemoglobin: 12 g/dL (ref 11.7–15.5)
MCH: 26.9 pg — ABNORMAL LOW (ref 27.0–33.0)
MCHC: 32.4 g/dL (ref 32.0–36.0)
MCV: 83 fL (ref 80.0–100.0)
MPV: 10.7 fL (ref 7.5–12.5)
Platelets: 370 10*3/uL (ref 140–400)
RBC: 4.46 10*6/uL (ref 3.80–5.10)
RDW: 12.8 % (ref 11.0–15.0)
WBC: 11.9 10*3/uL — ABNORMAL HIGH (ref 3.8–10.8)

## 2022-03-24 LAB — TSH: TSH: 0.86 mIU/L

## 2022-03-24 LAB — HEMOGLOBIN A1C
Hgb A1c MFr Bld: 5.3 % of total Hgb (ref <5.7)
Mean Plasma Glucose: 105 mg/dL
eAG (mmol/L): 5.8 mmol/L

## 2022-03-24 LAB — VITAMIN D 25 HYDROXY (VIT D DEFICIENCY, FRACTURES): Vit D, 25-Hydroxy: 27 ng/mL — ABNORMAL LOW (ref 30–100)

## 2022-03-27 ENCOUNTER — Encounter: Payer: Self-pay | Admitting: *Deleted

## 2022-04-14 ENCOUNTER — Other Ambulatory Visit: Payer: Self-pay | Admitting: Nurse Practitioner

## 2022-04-14 DIAGNOSIS — I10 Essential (primary) hypertension: Secondary | ICD-10-CM

## 2022-04-23 ENCOUNTER — Encounter: Payer: Self-pay | Admitting: Nurse Practitioner

## 2022-04-23 ENCOUNTER — Ambulatory Visit: Payer: BC Managed Care – PPO | Admitting: Nurse Practitioner

## 2022-04-23 VITALS — BP 128/84 | HR 100 | Temp 97.9°F | Ht 65.98 in | Wt 218.0 lb

## 2022-04-23 DIAGNOSIS — E669 Obesity, unspecified: Secondary | ICD-10-CM | POA: Diagnosis not present

## 2022-04-23 DIAGNOSIS — I10 Essential (primary) hypertension: Secondary | ICD-10-CM

## 2022-04-23 DIAGNOSIS — E559 Vitamin D deficiency, unspecified: Secondary | ICD-10-CM

## 2022-04-23 MED ORDER — HYDROCHLOROTHIAZIDE 25 MG PO TABS
25.0000 mg | ORAL_TABLET | Freq: Every day | ORAL | 3 refills | Status: DC
Start: 2022-04-23 — End: 2023-06-12

## 2022-04-23 NOTE — Progress Notes (Signed)
Established Patient Office Visit  Subjective   Patient ID: Veronica Castillo, female    DOB: 25-Mar-1990  Age: 32 y.o. MRN: [id number]  Chief Complaint  Patient presents with   Medical Management of Chronic Issues    BP follow up    HPI   HTN: Patient was seen by me on 03/23/2022.  Patient had a history of hypertension with pregnancy states that postpartum back to normal was not currently on antihypertensives or any form of birth control as she was not breast-feeding and no plans of becoming pregnant in the immediate future blood pressure was elevated at office and we did start patient on hydrochlorothiazide 25 mg daily she is here for follow-up and BMP recheck.  States that she feels fine on the medicaoitn and no side effects. States that she does have a wtist blood pressure that she will check it infrequent and it has been with in normal range    Exercise: states that she is starting to walk 30 mins a day. States that she is doing it twice a week currently .  Diet: states that she has limited pork intake as she noticed it would cause a headache    Review of Systems  Constitutional:  Negative for chills and fever.  Respiratory:  Negative for shortness of breath.   Cardiovascular:  Negative for chest pain and leg swelling.  Neurological:  Negative for dizziness and headaches.  Psychiatric/Behavioral:  Negative for hallucinations and suicidal ideas.       Objective:     BP 128/84 (BP Location: Left Arm)   Pulse 100   Temp 97.9 F (36.6 C) (Temporal)   Ht 5' 5.98" (1.676 m)   Wt 218 lb (98.9 kg)   LMP 03/23/2022 (Approximate)   SpO2 98%   BMI 35.21 kg/m  BP Readings from Last 3 Encounters:  04/23/22 128/84  03/23/22 (!) 136/92  01/30/22 (!) 160/94   Wt Readings from Last 3 Encounters:  04/23/22 218 lb (98.9 kg)  03/23/22 215 lb 6 oz (97.7 kg)  01/18/21 199 lb 3.2 oz (90.4 kg)      Physical Exam Vitals and nursing note reviewed.  Constitutional:      Appearance:  Normal appearance.  Cardiovascular:     Rate and Rhythm: Normal rate and regular rhythm.     Heart sounds: Normal heart sounds.  Pulmonary:     Effort: Pulmonary effort is normal.     Breath sounds: Normal breath sounds.  Neurological:     Mental Status: She is alert.      No results found for any visits on 04/23/22.    The ASCVD Risk score (Arnett DK, et al., 2019) failed to calculate for the following reasons:   The 2019 ASCVD risk score is only valid for ages 54 to 69    Assessment & Plan:   Problem List Items Addressed This Visit       Cardiovascular and Mediastinum   Primary hypertension - Primary    Patient currently maintained on hydrochlorothiazide 25 mg daily.  Patient taking medication as prescribed and tolerating it well per her report.  Blood pressure within normal limits we will draw BMP today to make sure potassium and kidneys are faring well.  Continue medication as prescribed      Relevant Medications   hydrochlorothiazide (HYDRODIURIL) 25 MG tablet   Other Relevant Orders   Basic metabolic panel     Other   Vitamin D insufficiency    Patient started  taking vitamin D 2000 IUs over-the-counter daily.      Obesity (BMI 30-39.9)    Patient is working on lifestyle modifications inclusive of diet and exercise continue       Return in about 1 year (around 04/23/2023) for CPE and Labs.    Audria Nine, NP

## 2022-04-23 NOTE — Patient Instructions (Signed)
Nice to see you today Blood pressure is looking good. We will continue the hydrochlorothiazide.  I will see you in a year for your physical, sooner if you need me

## 2022-04-23 NOTE — Assessment & Plan Note (Signed)
Patient currently maintained on hydrochlorothiazide 25 mg daily.  Patient taking medication as prescribed and tolerating it well per her report.  Blood pressure within normal limits we will draw BMP today to make sure potassium and kidneys are faring well.  Continue medication as prescribed

## 2022-04-23 NOTE — Assessment & Plan Note (Signed)
Patient started taking vitamin D 2000 IUs over-the-counter daily.

## 2022-04-23 NOTE — Assessment & Plan Note (Signed)
Patient is working on lifestyle modifications inclusive of diet and exercise continue

## 2022-04-24 LAB — BASIC METABOLIC PANEL
BUN: 13 mg/dL (ref 6–23)
CO2: 27 mEq/L (ref 19–32)
Calcium: 9.5 mg/dL (ref 8.4–10.5)
Chloride: 99 mEq/L (ref 96–112)
Creatinine, Ser: 0.87 mg/dL (ref 0.40–1.20)
GFR: 90.09 mL/min (ref >60.00)
Glucose, Bld: 83 mg/dL (ref 70–99)
Potassium: 3.8 mEq/L (ref 3.5–5.1)
Sodium: 134 mEq/L — ABNORMAL LOW (ref 135–145)

## 2022-08-08 ENCOUNTER — Other Ambulatory Visit: Payer: Self-pay | Admitting: Unknown Physician Specialty

## 2022-08-08 DIAGNOSIS — H9311 Tinnitus, right ear: Secondary | ICD-10-CM

## 2022-08-17 ENCOUNTER — Telehealth: Payer: BC Managed Care – PPO | Admitting: Physician Assistant

## 2022-08-17 DIAGNOSIS — H6990 Unspecified Eustachian tube disorder, unspecified ear: Secondary | ICD-10-CM | POA: Diagnosis not present

## 2022-08-17 MED ORDER — PREDNISONE 20 MG PO TABS: 40.0000 mg | ORAL_TABLET | Freq: Every day | ORAL | 0 refills | Status: DC

## 2022-08-17 MED ORDER — FLUTICASONE PROPIONATE 50 MCG/ACT NA SUSP: 2.0000 | Freq: Every day | NASAL | 0 refills | Status: AC

## 2022-08-17 NOTE — Progress Notes (Signed)
E-Visit for Ear Pain - Eustachian Tube Dysfunction   We are sorry that you are not feeling well. Here is how we plan to help!  Based on what you have shared with me it looks like you have Eustachian Tube Dysfunction.  Eustachian Tube Dysfunction is a condition where the tubes that connect your middle ears to your upper throat become blocked. This can lead to discomfort, hearing difficulties and a feeling of fullness in your ear. Eustachian tube dysfunction usually resolves itself in a few days. The usual symptoms include: Hearing problems Tinnitus, or ringing in your ears Clicking or popping sounds A feeling of fullness in your ears Pain that mimics an ear infection Dizziness, vertigo or balance problems A "tickling" sensation in your ears  ?Eustachian tube dysfunction symptoms may get worse in higher altitudes. This is called barotrauma, and it can happen while scuba diving, flying in an airplane or driving in the mountains.   What causes eustachian tube dysfunction? Allergies and infections (like the common cold and the flu) are the most common causes of eustachian tube dysfunction. These conditions can cause inflammation and mucus buildup, leading to blockage. GERD, or chronic acid reflux, can also cause ETD. This is because stomach acid can back up into your throat and result in inflammation. As mentioned above, altitude changes can also cause ETD.   What are some common eustachian tube dysfunction treatments? In most cases, treatment isn't necessary because ETD often resolves on its own. However, you might need treatment if your symptoms linger for more than two weeks.    Eustachian tube dysfunction treatment depends on the cause and the severity of your condition. Treatments may include home remedies, medications or, in severe cases, surgery.     HOME CARE: Sometimes simple home remedies can help with mild cases of eustachian tube dysfunction. To try and clear the blockage, you  can: Chew gum. Yawn. Swallow. Try the Valsalva maneuver (breathing out forcefully while closing your mouth and pinching your nostrils). Use a saline spray to clear out nasal passages.  MEDICATIONS: Over-the-counter medications can help if allergies are causing eustachian tube dysfunction. Try antihistamines (like cetirizine or diphenhydramine) to ease your symptoms. If you have discomfort, pain relievers -- such as acetaminophen or ibuprofen -- can help.  Sometimes intranasal glucocorticosteroids (like Flonase or Nasacort) help.  I have prescribed Fluticasone 50 mcg/spray 2 sprays in each nostril daily for 10-14 days. I have also prescribed a course of prednisone to take as directed. You need to schedule a follow-up with your PCP for ENT referral.    GET HELP RIGHT AWAY IF: Fever is over 102.2 degrees. You develop progressive ear pain or hearing loss. Ear symptoms persist longer than 3 days after treatment.  MAKE SURE YOU: Understand these instructions. Will watch your condition. Will get help right away if you are not doing well or get worse.  Thank you for choosing an e-visit.  Your e-visit answers were reviewed by a board certified advanced clinical practitioner to complete your personal care plan. Depending upon the condition, your plan could have included both over the counter or prescription medications.  Please review your pharmacy choice. Make sure the pharmacy is open so you can pick up the prescription now. If there is a problem, you may contact your provider through [employer] and have the prescription routed to another pharmacy.  Your safety is important to [place]. If you have drug allergies check your prescription carefully.   For the next 24 hours you  can use MyChart to ask questions about today's visit, request a non-urgent call back, or ask for a work or school excuse. You will get an email with a survey after your eVisit asking about your experience. We would  appreciate your feedback. I hope that your e-visit has been valuable and will aid in your recovery.

## 2022-08-17 NOTE — Progress Notes (Signed)
I have spent 5 minutes in review of e-visit questionnaire, review and updating patient chart, medical decision making and response to patient.   William Cody Martin, PA-C    

## 2022-08-21 ENCOUNTER — Encounter: Payer: Self-pay | Admitting: Nurse Practitioner

## 2022-08-21 DIAGNOSIS — H9311 Tinnitus, right ear: Secondary | ICD-10-CM

## 2022-08-22 ENCOUNTER — Encounter: Payer: Self-pay | Admitting: *Deleted

## 2022-08-23 ENCOUNTER — Encounter: Payer: Self-pay | Admitting: Family Medicine

## 2022-08-23 ENCOUNTER — Encounter: Payer: Self-pay | Admitting: Obstetrics and Gynecology

## 2022-10-08 ENCOUNTER — Encounter: Payer: Self-pay | Admitting: Nurse Practitioner

## 2022-10-15 ENCOUNTER — Encounter: Payer: Self-pay | Admitting: *Deleted

## 2022-10-15 ENCOUNTER — Ambulatory Visit: Payer: BC Managed Care – PPO | Admitting: Nurse Practitioner

## 2022-10-15 ENCOUNTER — Encounter: Payer: Self-pay | Admitting: Nurse Practitioner

## 2022-10-15 VITALS — BP 114/82 | HR 98 | Temp 98.5°F | Ht 65.98 in | Wt 214.6 lb

## 2022-10-15 DIAGNOSIS — N939 Abnormal uterine and vaginal bleeding, unspecified: Secondary | ICD-10-CM | POA: Diagnosis not present

## 2022-10-15 NOTE — Progress Notes (Signed)
Acute Office Visit  Subjective:     Patient ID: Veronica Castillo, female    DOB: 1990/12/25, 32 y.o.   MRN: [id number]  Chief Complaint  Patient presents with   Menstrual Problem    Pt complains of having longer periods. Pt states that her period started 9/18 and she is still on her cycle 10/14. Pt notices more blood clots than usual. Pt states she will go a month without any cycle at all.     HPI Patient is in today for menstrual problem with a history of htn. Malachi Bonds,  States that her cycle started on 09/19/2022 and has not stopped. The month prior it started on 08/06/2022 and went until 0/28/2024. Then the month piro she did not have one at one. No pain per patient report. States that her flow is average. States that she has had more blood clots but not big ones. States no chance of being.  Pap smear was 02/15/2020 that was normal   OCP: states that she has been on oral BC in the past. States that she did tolerate it well but it took several different ones until she found on that worked. State that she was on syld after the birth  FH Breast ca: No Galbladder disease: no Migraine with aura: no HTN: yes controlled with medications   Review of Systems  Constitutional:  Negative for chills and fever.  Respiratory:  Negative for shortness of breath.   Cardiovascular:  Negative for chest pain.  Genitourinary:  Negative for dysuria, frequency and hematuria.  Neurological:  Negative for headaches.  Psychiatric/Behavioral:  Negative for hallucinations and suicidal ideas.        Objective:    BP 114/82   Pulse 98   Temp 98.5 F (36.9 C) (Oral)   Ht 5' 5.98" (1.676 m)   Wt 214 lb 9.6 oz (97.3 kg)   LMP 09/19/2022 Comment: on cycle 10/14. irregular periods.  SpO2 97%   BMI 34.66 kg/m    Physical Exam Vitals and nursing note reviewed. Exam conducted with a chaperone present Kerry Dory, CMA).  Constitutional:      Appearance: Normal appearance.  Cardiovascular:     Rate  and Rhythm: Normal rate and regular rhythm.     Heart sounds: Normal heart sounds.  Pulmonary:     Effort: Pulmonary effort is normal.     Breath sounds: Normal breath sounds.  Abdominal:     General: Bowel sounds are normal. There is no distension.     Palpations: There is no mass.     Tenderness: There is no abdominal tenderness.     Hernia: No hernia is present.  Genitourinary:    Exam position: Lithotomy position.     Vagina: Bleeding present.     Cervix: Cervical bleeding present.  Neurological:     Mental Status: She is alert.     No results found for any visits on 10/15/22.      Assessment & Plan:   Problem List Items Addressed This Visit       Other   Vaginal bleeding - Primary    Will check blood count and pregnancy test today.  Stat ultrasound transvaginally done to rule out other etiologies such as fibroids.  If all comes back negative consider placing patient on birth control or doing medication to suppress bleeding.  Patient's mother has history of fibroids which required hospitalization      Relevant Orders   CBC   hCG, quantitative, pregnancy  US PELVIC COMPLETE WITH TRANSVAGINAL    No orders of the defined types were placed in this encounter.   No follow-ups on file.  Audria Nine, NP

## 2022-10-15 NOTE — Patient Instructions (Signed)
Nice to see you today I have ordered an ultrasound to be done I will be in touch with the labs once I have them Follow up as needed

## 2022-10-15 NOTE — Assessment & Plan Note (Signed)
Will check blood count and pregnancy test today.  Stat ultrasound transvaginally done to rule out other etiologies such as fibroids.  If all comes back negative consider placing patient on birth control or doing medication to suppress bleeding.  Patient's mother has history of fibroids which required hospitalization

## 2022-10-16 LAB — CBC
HCT: 35.4 % — ABNORMAL LOW (ref 36.0–46.0)
Hemoglobin: 11.1 g/dL — ABNORMAL LOW (ref 12.0–15.0)
MCHC: 31.3 g/dL (ref 30.0–36.0)
MCV: 82.7 fL (ref 78.0–100.0)
Platelets: 427 10*3/uL — ABNORMAL HIGH (ref 150.0–400.0)
RBC: 4.28 Mil/uL (ref 3.87–5.11)
RDW: 14.3 % (ref 11.5–15.5)
WBC: 9.8 10*3/uL (ref 4.0–10.5)

## 2022-10-16 LAB — HCG, QUANTITATIVE, PREGNANCY: Quantitative HCG: <0.6 m[IU]/mL

## 2022-10-17 ENCOUNTER — Ambulatory Visit
Admission: RE | Admit: 2022-10-17 | Discharge: 2022-10-17 | Disposition: A | Discharge status: Home / Self Care | Payer: BC Managed Care – PPO | Source: Ambulatory Visit | Attending: Nurse Practitioner | Admitting: Nurse Practitioner

## 2022-10-17 DIAGNOSIS — N939 Abnormal uterine and vaginal bleeding, unspecified: Secondary | ICD-10-CM | POA: Insufficient documentation

## 2022-10-18 ENCOUNTER — Other Ambulatory Visit: Payer: Self-pay | Admitting: Nurse Practitioner

## 2022-10-18 DIAGNOSIS — N939 Abnormal uterine and vaginal bleeding, unspecified: Secondary | ICD-10-CM

## 2022-10-18 MED ORDER — MEDROXYPROGESTERONE ACETATE 10 MG PO TABS: 10.0000 mg | ORAL_TABLET | Freq: Three times a day (TID) | ORAL | 0 refills | Status: DC

## 2022-10-22 ENCOUNTER — Encounter: Payer: Self-pay | Admitting: Nurse Practitioner

## 2022-10-22 DIAGNOSIS — H9319 Tinnitus, unspecified ear: Secondary | ICD-10-CM

## 2022-11-02 MED ORDER — MEDROXYPROGESTERONE ACETATE 10 MG PO TABS: 10.0000 mg | ORAL_TABLET | Freq: Three times a day (TID) | ORAL | 0 refills | Status: DC

## 2022-11-02 NOTE — Addendum Note (Signed)
Addended by: Eden Emms on: 11/02/2022 04:00 PM   Modules accepted: Orders

## 2022-11-05 NOTE — Telephone Encounter (Signed)
Please call patient to set up visit.

## 2022-11-12 ENCOUNTER — Encounter: Payer: Self-pay | Admitting: Nurse Practitioner

## 2022-11-12 ENCOUNTER — Ambulatory Visit (INDEPENDENT_AMBULATORY_CARE_PROVIDER_SITE_OTHER): Payer: BC Managed Care – PPO | Admitting: Nurse Practitioner

## 2022-11-12 VITALS — BP 116/78 | HR 89 | Temp 98.2°F | Ht 65.0 in | Wt 217.0 lb

## 2022-11-12 DIAGNOSIS — N889 Noninflammatory disorder of cervix uteri, unspecified: Secondary | ICD-10-CM | POA: Diagnosis not present

## 2022-11-12 DIAGNOSIS — N939 Abnormal uterine and vaginal bleeding, unspecified: Secondary | ICD-10-CM

## 2022-11-12 MED ORDER — NORETHINDRONE ACET-ETHINYL EST 1-20 MG-MCG PO TABS: 1.0000 | ORAL_TABLET | Freq: Every day | ORAL | 11 refills | Status: AC

## 2022-11-12 NOTE — Progress Notes (Signed)
   Established Patient Office Visit  Subjective   Patient ID: Veronica Castillo, female    DOB: November 22, 1990  Age: 32 y.o. MRN: [id number]  Chief Complaint  Patient presents with   Contraception    Pt prefers either pills or the patch for birth control.     HPI  AUB: Patient was seen by me on 10/15/2022 for vaginal bleeding. Checked a serum pregnancy test and Transvaginal US. She was placed on medroxyprogesterone p.o. that ceased the bleeding but it started back. She did a repeat and the bleeding stopped but has come back. She is here to discuss birth control options for AUB.  Patient states she is just slightly spotting currently patient states she has not been sexually active for an extended period of time  States that she did ocp back in 2018. States that she was using ocp in 2022 and it was expensive and she stopped.    Migraine with aura: No history of Gallbladder problems: No history of Breast Cancers: No history of Blood clots: No history of   Review of Systems  Constitutional:  Negative for chills and fever.  Respiratory:  Negative for shortness of breath.   Cardiovascular:  Negative for chest pain.  Neurological:  Negative for headaches.  Psychiatric/Behavioral:  Negative for hallucinations and suicidal ideas.       Objective:     BP 116/78   Pulse 89   Temp 98.2 F (36.8 C) (Oral)   Ht 5\' 5"  (1.651 m)   Wt 217 lb (98.4 kg)   LMP 09/19/2022 Comment: on cycle 10/14. irregular periods. 11/11 On end of cycle. still spotting.  SpO2 98%   BMI 36.11 kg/m    Physical Exam Vitals and nursing note reviewed.  Constitutional:      Appearance: Normal appearance.  Cardiovascular:     Rate and Rhythm: Normal rate and regular rhythm.     Heart sounds: Normal heart sounds.  Pulmonary:     Effort: Pulmonary effort is normal.     Breath sounds: Normal breath sounds.  Neurological:     Mental Status: She is alert.      No results found for any visits on  11/12/22.    The ASCVD Risk score (Arnett DK, et al., 2019) failed to calculate for the following reasons:   The 2019 ASCVD risk score is only valid for ages 2 to 30    Assessment & Plan:   Problem List Items Addressed This Visit       Genitourinary   Abnormal uterine bleeding (AUB)    History of the same.  Patient was pregnancy tested which was negative underwent transvaginal ultrasound which showed no concerning signs or symptoms patient was put on medroxyprogesterone which stopped the bleeding and have withdrawal bleeding and now she is spotting.  We will put patient on birth control to regulate bleeding and periods.      Relevant Medications   norethindrone-ethinyl estradiol (JUNEL 1/20) 1-20 MG-MCG tablet   Lesion of cervix - Primary    Nabothian cyst noticed but recommended a sonohysterogram.  Order placed today      Relevant Orders   [place] Sonohysterogram    Return if symptoms worsen or fail to improve, for As needed.    Audria Nine, NP

## 2022-11-12 NOTE — Patient Instructions (Signed)
Nice to see you today Follow up with me as scheduled, sooner if you need me

## 2022-11-12 NOTE — Assessment & Plan Note (Signed)
Nabothian cyst noticed but recommended a sonohysterogram.  Order placed today

## 2022-11-12 NOTE — Assessment & Plan Note (Signed)
History of the same.  Patient was pregnancy tested which was negative underwent transvaginal ultrasound which showed no concerning signs or symptoms patient was put on medroxyprogesterone which stopped the bleeding and have withdrawal bleeding and now she is spotting.  We will put patient on birth control to regulate bleeding and periods.

## 2022-11-14 ENCOUNTER — Encounter: Payer: Self-pay | Admitting: *Deleted

## 2022-11-27 NOTE — Addendum Note (Signed)
Addended by: Eden Emms on: 11/27/2022 12:54 PM   Modules accepted: Orders

## 2022-12-06 ENCOUNTER — Encounter: Payer: Self-pay | Admitting: *Deleted

## 2022-12-12 ENCOUNTER — Encounter: Payer: Self-pay | Admitting: Nurse Practitioner

## 2023-04-25 ENCOUNTER — Encounter: Payer: BC Managed Care – PPO | Admitting: Nurse Practitioner

## 2023-06-12 ENCOUNTER — Other Ambulatory Visit: Payer: Self-pay | Admitting: Nurse Practitioner

## 2023-06-12 DIAGNOSIS — I10 Essential (primary) hypertension: Secondary | ICD-10-CM

## 2023-06-12 NOTE — Telephone Encounter (Signed)
 Needs CPE in 30 days to continue getting refills

## 2023-06-13 NOTE — Telephone Encounter (Signed)
 lvm for pt to call office to schedule appt.

## 2023-06-17 NOTE — Telephone Encounter (Signed)
 Sent mychart and lvm to schedule

## 2023-06-19 NOTE — Telephone Encounter (Signed)
 Lvmtcb 3rd attempt.

## 2023-07-03 ENCOUNTER — Encounter: Admitting: Nurse Practitioner

## 2023-07-17 ENCOUNTER — Other Ambulatory Visit: Payer: Self-pay | Admitting: Nurse Practitioner

## 2023-07-17 DIAGNOSIS — I10 Essential (primary) hypertension: Secondary | ICD-10-CM

## 2023-08-29 ENCOUNTER — Other Ambulatory Visit: Payer: Self-pay | Admitting: Nurse Practitioner

## 2023-08-29 DIAGNOSIS — I10 Essential (primary) hypertension: Secondary | ICD-10-CM

## 2023-10-15 ENCOUNTER — Telehealth: Payer: Self-pay

## 2023-10-15 NOTE — Telephone Encounter (Signed)
 LAST APPOINTMENT DATE: not found   NEXT APPOINTMENT DATE: Visit date not found  Hydrochlorothiazide  25 mg   LAST REFILL: 08/29/23  QTY: #30, 1RF    Need office visit? Looks like she last canceled due to financial reasons.

## 2023-10-16 NOTE — Telephone Encounter (Signed)
 She needs a visit. It has been a year and some change since I have gotten labs on her. This medication can effect her kidneys  Amy - any resources to see if we can help with the cost of a visit. I am willing to do a CPE. That should be covered if she is insured

## 2023-10-20 ENCOUNTER — Other Ambulatory Visit: Payer: Self-pay | Admitting: Nurse Practitioner

## 2023-10-20 DIAGNOSIS — N939 Abnormal uterine and vaginal bleeding, unspecified: Secondary | ICD-10-CM

## 2023-11-14 ENCOUNTER — Encounter: Admitting: Nurse Practitioner

## 2023-11-27 ENCOUNTER — Ambulatory Visit: Payer: Self-pay | Admitting: Nurse Practitioner

## 2023-12-27 ENCOUNTER — Ambulatory Visit: Admitting: Nurse Practitioner

## 2024-01-09 ENCOUNTER — Ambulatory Visit: Admitting: Nurse Practitioner

## 2024-01-22 ENCOUNTER — Other Ambulatory Visit: Payer: Self-pay | Admitting: Family

## 2024-01-22 DIAGNOSIS — I10 Essential (primary) hypertension: Secondary | ICD-10-CM
